# Patient Record
Sex: Male | Born: 1960 | Race: White | Hispanic: No | Marital: Married | Smoking: Never smoker
Health system: Southern US, Community
[De-identification: ages and names within clinical notes are randomized; demographics above are authoritative.]

## PROBLEM LIST (undated history)

## (undated) DIAGNOSIS — Z86010 Personal history of colonic polyps: Secondary | ICD-10-CM

## (undated) DIAGNOSIS — M2550 Pain in unspecified joint: Secondary | ICD-10-CM

## (undated) DIAGNOSIS — I10 Essential (primary) hypertension: Secondary | ICD-10-CM

## (undated) DIAGNOSIS — K219 Gastro-esophageal reflux disease without esophagitis: Secondary | ICD-10-CM

## (undated) DIAGNOSIS — T7840XA Allergy, unspecified, initial encounter: Secondary | ICD-10-CM

## (undated) DIAGNOSIS — R0602 Shortness of breath: Secondary | ICD-10-CM

## (undated) DIAGNOSIS — K589 Irritable bowel syndrome without diarrhea: Secondary | ICD-10-CM

## (undated) DIAGNOSIS — Z21 Asymptomatic human immunodeficiency virus [HIV] infection status: Secondary | ICD-10-CM

## (undated) DIAGNOSIS — R600 Localized edema: Secondary | ICD-10-CM

## (undated) DIAGNOSIS — J189 Pneumonia, unspecified organism: Secondary | ICD-10-CM

## (undated) DIAGNOSIS — B20 Human immunodeficiency virus [HIV] disease: Secondary | ICD-10-CM

## (undated) DIAGNOSIS — M545 Low back pain, unspecified: Secondary | ICD-10-CM

## (undated) DIAGNOSIS — E7800 Pure hypercholesterolemia, unspecified: Secondary | ICD-10-CM

## (undated) DIAGNOSIS — E559 Vitamin D deficiency, unspecified: Secondary | ICD-10-CM

## (undated) DIAGNOSIS — M1990 Unspecified osteoarthritis, unspecified site: Secondary | ICD-10-CM

## (undated) DIAGNOSIS — K2270 Barrett's esophagus without dysplasia: Secondary | ICD-10-CM

## (undated) DIAGNOSIS — K5900 Constipation, unspecified: Secondary | ICD-10-CM

## (undated) HISTORY — PX: ESOPHAGOGASTRODUODENOSCOPY: SHX1529

## (undated) HISTORY — DX: Personal history of colonic polyps: Z86.010

## (undated) HISTORY — PX: TONSILLECTOMY: SUR1361

## (undated) HISTORY — DX: Asymptomatic human immunodeficiency virus (hiv) infection status: Z21

## (undated) HISTORY — DX: Irritable bowel syndrome, unspecified: K58.9

## (undated) HISTORY — DX: Human immunodeficiency virus (HIV) disease: B20

## (undated) HISTORY — PX: COLONOSCOPY: SHX174

## (undated) HISTORY — DX: Low back pain, unspecified: M54.50

## (undated) HISTORY — PX: POLYPECTOMY: SHX149

## (undated) HISTORY — DX: Constipation, unspecified: K59.00

## (undated) HISTORY — DX: Pure hypercholesterolemia, unspecified: E78.00

## (undated) HISTORY — DX: Unspecified osteoarthritis, unspecified site: M19.90

## (undated) HISTORY — DX: Localized edema: R60.0

## (undated) HISTORY — DX: Vitamin D deficiency, unspecified: E55.9

## (undated) HISTORY — PX: COLONOSCOPY W/ BIOPSIES AND POLYPECTOMY: SHX1376

## (undated) HISTORY — PX: UPPER GASTROINTESTINAL ENDOSCOPY: SHX188

## (undated) HISTORY — DX: Shortness of breath: R06.02

## (undated) HISTORY — DX: Pain in unspecified joint: M25.50

## (undated) HISTORY — PX: BACK SURGERY: SHX140

## (undated) HISTORY — DX: Allergy, unspecified, initial encounter: T78.40XA

## (undated) HISTORY — DX: Barrett's esophagus without dysplasia: K22.70

---

## 2001-10-06 ENCOUNTER — Encounter: Payer: Self-pay | Admitting: Emergency Medicine

## 2001-10-07 ENCOUNTER — Inpatient Hospital Stay (HOSPITAL_COMMUNITY): Admission: EM | Admit: 2001-10-07 | Discharge: 2001-10-13 | Payer: Self-pay | Admitting: Emergency Medicine

## 2001-10-09 ENCOUNTER — Encounter (INDEPENDENT_AMBULATORY_CARE_PROVIDER_SITE_OTHER): Payer: Self-pay | Admitting: *Deleted

## 2001-10-09 LAB — CONVERTED CEMR LAB
CD4 Count: 20 microliters
CD4 T Cell Abs: 20

## 2001-10-28 ENCOUNTER — Encounter: Payer: Self-pay | Admitting: Infectious Diseases

## 2001-10-28 ENCOUNTER — Encounter: Admission: RE | Admit: 2001-10-28 | Discharge: 2001-10-28 | Payer: Self-pay | Admitting: Infectious Diseases

## 2001-11-26 ENCOUNTER — Encounter: Admission: RE | Admit: 2001-11-26 | Discharge: 2001-11-26 | Payer: Self-pay | Admitting: Infectious Diseases

## 2002-01-22 ENCOUNTER — Ambulatory Visit (HOSPITAL_COMMUNITY): Admission: RE | Admit: 2002-01-22 | Discharge: 2002-01-22 | Payer: Self-pay | Admitting: Infectious Diseases

## 2002-01-22 ENCOUNTER — Encounter: Admission: RE | Admit: 2002-01-22 | Discharge: 2002-01-22 | Payer: Self-pay | Admitting: Infectious Diseases

## 2002-03-18 ENCOUNTER — Ambulatory Visit (HOSPITAL_COMMUNITY): Admission: RE | Admit: 2002-03-18 | Discharge: 2002-03-18 | Payer: Self-pay | Admitting: Infectious Diseases

## 2002-03-18 ENCOUNTER — Encounter: Admission: RE | Admit: 2002-03-18 | Discharge: 2002-03-18 | Payer: Self-pay | Admitting: Infectious Diseases

## 2002-04-21 ENCOUNTER — Encounter: Admission: RE | Admit: 2002-04-21 | Discharge: 2002-04-21 | Payer: Self-pay | Admitting: Infectious Diseases

## 2002-07-01 ENCOUNTER — Encounter: Admission: RE | Admit: 2002-07-01 | Discharge: 2002-07-01 | Payer: Self-pay | Admitting: Internal Medicine

## 2002-08-03 ENCOUNTER — Encounter: Admission: RE | Admit: 2002-08-03 | Discharge: 2002-08-03 | Payer: Self-pay | Admitting: Infectious Diseases

## 2002-08-03 ENCOUNTER — Ambulatory Visit (HOSPITAL_COMMUNITY): Admission: RE | Admit: 2002-08-03 | Discharge: 2002-08-03 | Payer: Self-pay | Admitting: Infectious Diseases

## 2002-10-16 ENCOUNTER — Encounter: Admission: RE | Admit: 2002-10-16 | Discharge: 2002-10-16 | Payer: Self-pay | Admitting: Internal Medicine

## 2002-10-16 ENCOUNTER — Ambulatory Visit (HOSPITAL_COMMUNITY): Admission: RE | Admit: 2002-10-16 | Discharge: 2002-10-16 | Payer: Self-pay | Admitting: Infectious Diseases

## 2002-11-02 ENCOUNTER — Encounter: Admission: RE | Admit: 2002-11-02 | Discharge: 2002-11-02 | Payer: Self-pay | Admitting: Infectious Diseases

## 2002-12-24 ENCOUNTER — Emergency Department (HOSPITAL_COMMUNITY): Admission: EM | Admit: 2002-12-24 | Discharge: 2002-12-24 | Payer: Self-pay | Admitting: Emergency Medicine

## 2002-12-24 ENCOUNTER — Encounter: Payer: Self-pay | Admitting: Emergency Medicine

## 2002-12-30 ENCOUNTER — Ambulatory Visit (HOSPITAL_COMMUNITY): Admission: RE | Admit: 2002-12-30 | Discharge: 2002-12-30 | Payer: Self-pay | Admitting: Infectious Diseases

## 2002-12-30 ENCOUNTER — Encounter: Admission: RE | Admit: 2002-12-30 | Discharge: 2002-12-30 | Payer: Self-pay | Admitting: Infectious Diseases

## 2003-04-05 ENCOUNTER — Encounter: Payer: Self-pay | Admitting: Infectious Diseases

## 2003-04-05 ENCOUNTER — Encounter: Admission: RE | Admit: 2003-04-05 | Discharge: 2003-04-05 | Payer: Self-pay | Admitting: Infectious Diseases

## 2003-04-21 ENCOUNTER — Encounter: Admission: RE | Admit: 2003-04-21 | Discharge: 2003-04-21 | Payer: Self-pay | Admitting: Infectious Diseases

## 2003-07-07 ENCOUNTER — Encounter: Payer: Self-pay | Admitting: Internal Medicine

## 2003-07-07 ENCOUNTER — Ambulatory Visit (HOSPITAL_COMMUNITY): Admission: RE | Admit: 2003-07-07 | Discharge: 2003-07-07 | Payer: Self-pay | Admitting: Internal Medicine

## 2003-07-07 ENCOUNTER — Encounter: Admission: RE | Admit: 2003-07-07 | Discharge: 2003-07-07 | Payer: Self-pay | Admitting: Infectious Diseases

## 2003-07-28 ENCOUNTER — Encounter: Admission: RE | Admit: 2003-07-28 | Discharge: 2003-07-28 | Payer: Self-pay | Admitting: Infectious Diseases

## 2003-10-05 ENCOUNTER — Encounter: Admission: RE | Admit: 2003-10-05 | Discharge: 2003-10-05 | Payer: Self-pay | Admitting: Internal Medicine

## 2003-10-13 ENCOUNTER — Ambulatory Visit (HOSPITAL_COMMUNITY): Admission: RE | Admit: 2003-10-13 | Discharge: 2003-10-13 | Payer: Self-pay | Admitting: Infectious Diseases

## 2003-10-13 ENCOUNTER — Encounter: Admission: RE | Admit: 2003-10-13 | Discharge: 2003-10-13 | Payer: Self-pay | Admitting: Infectious Diseases

## 2003-10-13 ENCOUNTER — Encounter: Payer: Self-pay | Admitting: Infectious Diseases

## 2003-10-27 ENCOUNTER — Encounter: Admission: RE | Admit: 2003-10-27 | Discharge: 2003-10-27 | Payer: Self-pay | Admitting: Infectious Diseases

## 2003-10-27 ENCOUNTER — Ambulatory Visit (HOSPITAL_COMMUNITY): Admission: RE | Admit: 2003-10-27 | Discharge: 2003-10-27 | Payer: Self-pay | Admitting: Infectious Diseases

## 2003-10-27 ENCOUNTER — Encounter: Payer: Self-pay | Admitting: Infectious Diseases

## 2003-12-03 ENCOUNTER — Encounter: Admission: RE | Admit: 2003-12-03 | Discharge: 2003-12-03 | Payer: Self-pay | Admitting: Infectious Diseases

## 2003-12-03 ENCOUNTER — Ambulatory Visit (HOSPITAL_COMMUNITY): Admission: RE | Admit: 2003-12-03 | Discharge: 2003-12-03 | Payer: Self-pay | Admitting: Infectious Diseases

## 2003-12-03 ENCOUNTER — Encounter: Payer: Self-pay | Admitting: Infectious Diseases

## 2003-12-20 ENCOUNTER — Encounter: Admission: RE | Admit: 2003-12-20 | Discharge: 2003-12-20 | Payer: Self-pay | Admitting: Infectious Diseases

## 2004-03-08 ENCOUNTER — Ambulatory Visit (HOSPITAL_COMMUNITY): Admission: RE | Admit: 2004-03-08 | Discharge: 2004-03-08 | Payer: Self-pay | Admitting: Internal Medicine

## 2004-03-08 ENCOUNTER — Encounter: Admission: RE | Admit: 2004-03-08 | Discharge: 2004-03-08 | Payer: Self-pay | Admitting: Infectious Diseases

## 2004-03-22 ENCOUNTER — Encounter: Admission: RE | Admit: 2004-03-22 | Discharge: 2004-03-22 | Payer: Self-pay | Admitting: Infectious Diseases

## 2004-07-19 ENCOUNTER — Ambulatory Visit (HOSPITAL_COMMUNITY): Admission: RE | Admit: 2004-07-19 | Discharge: 2004-07-19 | Payer: Self-pay | Admitting: Infectious Diseases

## 2004-07-19 ENCOUNTER — Encounter: Admission: RE | Admit: 2004-07-19 | Discharge: 2004-07-19 | Payer: Self-pay | Admitting: Infectious Diseases

## 2004-08-02 ENCOUNTER — Encounter: Admission: RE | Admit: 2004-08-02 | Discharge: 2004-08-02 | Payer: Self-pay | Admitting: Infectious Diseases

## 2004-10-25 ENCOUNTER — Ambulatory Visit: Payer: Self-pay | Admitting: Infectious Diseases

## 2004-10-25 ENCOUNTER — Ambulatory Visit (HOSPITAL_COMMUNITY): Admission: RE | Admit: 2004-10-25 | Discharge: 2004-10-25 | Payer: Self-pay | Admitting: Infectious Diseases

## 2005-01-25 ENCOUNTER — Ambulatory Visit (HOSPITAL_COMMUNITY): Admission: RE | Admit: 2005-01-25 | Discharge: 2005-01-25 | Payer: Self-pay | Admitting: Infectious Diseases

## 2005-01-25 ENCOUNTER — Ambulatory Visit: Payer: Self-pay | Admitting: Infectious Diseases

## 2005-02-12 ENCOUNTER — Ambulatory Visit: Payer: Self-pay | Admitting: Infectious Diseases

## 2005-05-09 ENCOUNTER — Ambulatory Visit: Payer: Self-pay | Admitting: Infectious Diseases

## 2005-05-09 ENCOUNTER — Ambulatory Visit (HOSPITAL_COMMUNITY): Admission: RE | Admit: 2005-05-09 | Discharge: 2005-05-09 | Payer: Self-pay | Admitting: Infectious Diseases

## 2005-07-06 ENCOUNTER — Ambulatory Visit: Payer: Self-pay | Admitting: Infectious Diseases

## 2005-10-24 ENCOUNTER — Ambulatory Visit (HOSPITAL_COMMUNITY): Admission: RE | Admit: 2005-10-24 | Discharge: 2005-10-24 | Payer: Self-pay | Admitting: Infectious Diseases

## 2005-10-24 ENCOUNTER — Ambulatory Visit: Payer: Self-pay | Admitting: Infectious Diseases

## 2005-11-07 ENCOUNTER — Ambulatory Visit (HOSPITAL_COMMUNITY): Admission: RE | Admit: 2005-11-07 | Discharge: 2005-11-07 | Payer: Self-pay | Admitting: Infectious Diseases

## 2005-11-07 ENCOUNTER — Ambulatory Visit: Payer: Self-pay | Admitting: Infectious Diseases

## 2005-11-14 ENCOUNTER — Ambulatory Visit: Payer: Self-pay | Admitting: Infectious Diseases

## 2006-02-05 ENCOUNTER — Ambulatory Visit: Payer: Self-pay | Admitting: Infectious Diseases

## 2006-02-20 ENCOUNTER — Ambulatory Visit: Payer: Self-pay | Admitting: Infectious Diseases

## 2006-04-24 ENCOUNTER — Encounter: Admission: RE | Admit: 2006-04-24 | Discharge: 2006-04-24 | Payer: Self-pay | Admitting: Infectious Diseases

## 2006-04-24 ENCOUNTER — Ambulatory Visit: Payer: Self-pay | Admitting: Infectious Diseases

## 2006-09-02 ENCOUNTER — Encounter (INDEPENDENT_AMBULATORY_CARE_PROVIDER_SITE_OTHER): Payer: Self-pay | Admitting: *Deleted

## 2006-09-02 ENCOUNTER — Encounter: Admission: RE | Admit: 2006-09-02 | Discharge: 2006-09-02 | Payer: Self-pay | Admitting: Infectious Diseases

## 2006-09-02 ENCOUNTER — Ambulatory Visit: Payer: Self-pay | Admitting: Infectious Diseases

## 2006-09-18 ENCOUNTER — Ambulatory Visit: Payer: Self-pay | Admitting: Infectious Diseases

## 2006-09-18 ENCOUNTER — Encounter (INDEPENDENT_AMBULATORY_CARE_PROVIDER_SITE_OTHER): Payer: Self-pay | Admitting: *Deleted

## 2006-09-18 LAB — CONVERTED CEMR LAB
CD4 Count: 470 microliters
HIV 1 RNA Quant: 77399 copies/mL

## 2006-09-19 ENCOUNTER — Ambulatory Visit (HOSPITAL_COMMUNITY): Admission: RE | Admit: 2006-09-19 | Discharge: 2006-09-19 | Payer: Self-pay | Admitting: Infectious Diseases

## 2006-09-30 DIAGNOSIS — B59 Pneumocystosis: Secondary | ICD-10-CM

## 2006-09-30 DIAGNOSIS — E881 Lipodystrophy, not elsewhere classified: Secondary | ICD-10-CM

## 2006-09-30 DIAGNOSIS — B20 Human immunodeficiency virus [HIV] disease: Secondary | ICD-10-CM | POA: Insufficient documentation

## 2006-09-30 DIAGNOSIS — A6000 Herpesviral infection of urogenital system, unspecified: Secondary | ICD-10-CM

## 2007-01-01 ENCOUNTER — Encounter (INDEPENDENT_AMBULATORY_CARE_PROVIDER_SITE_OTHER): Payer: Self-pay | Admitting: *Deleted

## 2007-01-01 ENCOUNTER — Ambulatory Visit: Payer: Self-pay | Admitting: Infectious Diseases

## 2007-01-01 ENCOUNTER — Encounter: Admission: RE | Admit: 2007-01-01 | Discharge: 2007-01-01 | Payer: Self-pay | Admitting: Infectious Diseases

## 2007-01-01 LAB — CONVERTED CEMR LAB
ALT: 16 units/L (ref 0–53)
AST: 15 units/L (ref 0–37)
Albumin: 4.6 g/dL (ref 3.5–5.2)
Alkaline Phosphatase: 109 units/L (ref 39–117)
BUN: 10 mg/dL (ref 6–23)
Basophils Absolute: 0 10*3/uL (ref 0.0–0.1)
Basophils Relative: 0 % (ref 0–1)
CD4 Count: 460 microliters
CO2: 23 meq/L (ref 19–32)
Calcium: 9 mg/dL (ref 8.4–10.5)
Chloride: 105 meq/L (ref 96–112)
Creatinine, Ser: 0.88 mg/dL (ref 0.40–1.50)
Eosinophils Relative: 3 % (ref 0–5)
Glucose, Bld: 101 mg/dL — ABNORMAL HIGH (ref 70–99)
HCT: 41.4 % (ref 39.0–52.0)
HIV 1 RNA Quant: 275 copies/mL
HIV 1 RNA Quant: 275 copies/mL — ABNORMAL HIGH (ref <50)
HIV-1 RNA Quant, Log: 2.44 — ABNORMAL HIGH (ref <1.70)
Hemoglobin: 15 g/dL (ref 13.0–17.0)
Lymphocytes Relative: 33 % (ref 12–46)
Lymphs Abs: 1.7 10*3/uL (ref 0.7–3.3)
MCHC: 36.2 g/dL — ABNORMAL HIGH (ref 30.0–36.0)
MCV: 90.6 fL (ref 78.0–100.0)
Monocytes Absolute: 0.4 10*3/uL (ref 0.2–0.7)
Monocytes Relative: 7 % (ref 3–11)
Neutro Abs: 3 10*3/uL (ref 1.7–7.7)
Neutrophils Relative %: 57 % (ref 43–77)
Platelets: 208 10*3/uL (ref 150–400)
Potassium: 4 meq/L (ref 3.5–5.3)
RBC: 4.57 M/uL (ref 4.22–5.81)
RDW: 13 % (ref 11.5–14.0)
Sodium: 138 meq/L (ref 135–145)
Total Bilirubin: 0.4 mg/dL (ref 0.3–1.2)
Total Protein: 6.8 g/dL (ref 6.0–8.3)
WBC: 5.3 10*3/uL (ref 4.0–10.5)

## 2007-01-02 DIAGNOSIS — E781 Pure hyperglyceridemia: Secondary | ICD-10-CM | POA: Insufficient documentation

## 2007-01-02 DIAGNOSIS — J309 Allergic rhinitis, unspecified: Secondary | ICD-10-CM | POA: Insufficient documentation

## 2007-01-27 ENCOUNTER — Encounter: Payer: Self-pay | Admitting: Infectious Diseases

## 2007-01-27 ENCOUNTER — Ambulatory Visit: Payer: Self-pay | Admitting: Infectious Diseases

## 2007-01-27 LAB — CONVERTED CEMR LAB: Testosterone: 360.47 ng/dL (ref 350–890)

## 2007-02-10 ENCOUNTER — Encounter (INDEPENDENT_AMBULATORY_CARE_PROVIDER_SITE_OTHER): Payer: Self-pay | Admitting: *Deleted

## 2007-02-10 LAB — CONVERTED CEMR LAB

## 2007-02-19 ENCOUNTER — Telehealth: Payer: Self-pay | Admitting: Infectious Diseases

## 2007-02-23 ENCOUNTER — Encounter (INDEPENDENT_AMBULATORY_CARE_PROVIDER_SITE_OTHER): Payer: Self-pay | Admitting: *Deleted

## 2007-02-24 ENCOUNTER — Encounter (INDEPENDENT_AMBULATORY_CARE_PROVIDER_SITE_OTHER): Payer: Self-pay | Admitting: *Deleted

## 2007-02-24 ENCOUNTER — Telehealth (INDEPENDENT_AMBULATORY_CARE_PROVIDER_SITE_OTHER): Payer: Self-pay | Admitting: Infectious Diseases

## 2007-06-24 ENCOUNTER — Ambulatory Visit: Payer: Self-pay | Admitting: Infectious Diseases

## 2007-06-24 ENCOUNTER — Encounter: Admission: RE | Admit: 2007-06-24 | Discharge: 2007-06-24 | Payer: Self-pay | Admitting: Infectious Diseases

## 2007-06-24 LAB — CONVERTED CEMR LAB
AST: 18 units/L (ref 0–37)
Albumin: 4.6 g/dL (ref 3.5–5.2)
Alkaline Phosphatase: 109 units/L (ref 39–117)
BUN: 14 mg/dL (ref 6–23)
Calcium: 9.4 mg/dL (ref 8.4–10.5)
Chloride: 106 meq/L (ref 96–112)
Eosinophils Absolute: 0.1 10*3/uL (ref 0.0–0.7)
Glucose, Bld: 95 mg/dL (ref 70–99)
HIV 1 RNA Quant: <50 copies/mL (ref <50)
HIV-1 RNA Quant, Log: <1.7 (ref <1.70)
LDL Cholesterol: 137 mg/dL — ABNORMAL HIGH (ref 0–99)
Leukocytes, UA: NEGATIVE
Lymphs Abs: 1.5 10*3/uL (ref 0.7–3.3)
MCV: 93.4 fL (ref 78.0–100.0)
Monocytes Relative: 6 % (ref 3–11)
Neutro Abs: 3.3 10*3/uL (ref 1.7–7.7)
Neutrophils Relative %: 63 % (ref 43–77)
Nitrite: NEGATIVE
Platelets: 202 10*3/uL (ref 150–400)
Potassium: 4.1 meq/L (ref 3.5–5.3)
RBC: 4.83 M/uL (ref 4.22–5.81)
Sodium: 139 meq/L (ref 135–145)
Specific Gravity, Urine: 1.029 (ref 1.005–1.03)
Total Protein: 7.4 g/dL (ref 6.0–8.3)
Triglycerides: 203 mg/dL — ABNORMAL HIGH (ref <150)
Urobilinogen, UA: 0.2 (ref 0.0–1.0)
WBC: 5.2 10*3/uL (ref 4.0–10.5)
pH: 6 (ref 5.0–8.0)

## 2007-06-25 ENCOUNTER — Encounter: Payer: Self-pay | Admitting: Infectious Diseases

## 2007-06-25 LAB — CONVERTED CEMR LAB: CD4 Count: 410 microliters

## 2007-07-16 ENCOUNTER — Ambulatory Visit: Payer: Self-pay | Admitting: Infectious Diseases

## 2007-07-16 ENCOUNTER — Encounter (INDEPENDENT_AMBULATORY_CARE_PROVIDER_SITE_OTHER): Payer: Self-pay | Admitting: *Deleted

## 2007-07-18 ENCOUNTER — Encounter: Payer: Self-pay | Admitting: Infectious Diseases

## 2007-07-25 ENCOUNTER — Telehealth: Payer: Self-pay

## 2007-07-25 ENCOUNTER — Telehealth: Payer: Self-pay | Admitting: Infectious Diseases

## 2007-07-28 ENCOUNTER — Telehealth: Payer: Self-pay | Admitting: Infectious Diseases

## 2007-07-29 ENCOUNTER — Encounter: Payer: Self-pay | Admitting: Infectious Diseases

## 2007-08-15 ENCOUNTER — Telehealth: Payer: Self-pay | Admitting: Infectious Diseases

## 2007-11-03 ENCOUNTER — Encounter: Admission: RE | Admit: 2007-11-03 | Discharge: 2007-11-03 | Payer: Self-pay | Admitting: Infectious Diseases

## 2007-11-03 ENCOUNTER — Ambulatory Visit: Payer: Self-pay | Admitting: Infectious Diseases

## 2007-11-03 LAB — CONVERTED CEMR LAB
ALT: 16 units/L (ref 0–53)
AST: 16 units/L (ref 0–37)
Basophils Absolute: 0 10*3/uL (ref 0.0–0.1)
Basophils Relative: 0 % (ref 0–1)
Calcium: 9.1 mg/dL (ref 8.4–10.5)
Chloride: 107 meq/L (ref 96–112)
Creatinine, Ser: 0.91 mg/dL (ref 0.40–1.50)
MCHC: 33.5 g/dL (ref 30.0–36.0)
Monocytes Absolute: 0.3 10*3/uL (ref 0.1–1.0)
Neutro Abs: 2.8 10*3/uL (ref 1.7–7.7)
Neutrophils Relative %: 61 % (ref 43–77)
Platelets: 163 10*3/uL (ref 150–400)
Potassium: 4.5 meq/L (ref 3.5–5.3)
RDW: 13.4 % (ref 11.5–15.5)
Sodium: 140 meq/L (ref 135–145)
Total Protein: 6.8 g/dL (ref 6.0–8.3)

## 2007-11-17 ENCOUNTER — Ambulatory Visit: Payer: Self-pay | Admitting: Infectious Diseases

## 2007-12-16 ENCOUNTER — Encounter (INDEPENDENT_AMBULATORY_CARE_PROVIDER_SITE_OTHER): Payer: Self-pay | Admitting: *Deleted

## 2007-12-18 HISTORY — PX: WISDOM TOOTH EXTRACTION: SHX21

## 2008-03-01 ENCOUNTER — Encounter: Admission: RE | Admit: 2008-03-01 | Discharge: 2008-03-01 | Payer: Self-pay | Admitting: Infectious Diseases

## 2008-03-01 ENCOUNTER — Ambulatory Visit: Payer: Self-pay | Admitting: Infectious Diseases

## 2008-03-01 ENCOUNTER — Encounter (INDEPENDENT_AMBULATORY_CARE_PROVIDER_SITE_OTHER): Payer: Self-pay | Admitting: *Deleted

## 2008-03-01 LAB — CONVERTED CEMR LAB
AST: 17 units/L (ref 0–37)
Alkaline Phosphatase: 108 units/L (ref 39–117)
BUN: 11 mg/dL (ref 6–23)
Basophils Relative: 0 % (ref 0–1)
Creatinine, Ser: 0.84 mg/dL (ref 0.40–1.50)
Eosinophils Absolute: 0.1 10*3/uL (ref 0.0–0.7)
MCHC: 35.1 g/dL (ref 30.0–36.0)
MCV: 92.4 fL (ref 78.0–100.0)
Monocytes Absolute: 0.3 10*3/uL (ref 0.1–1.0)
Monocytes Relative: 7 % (ref 3–12)
Neutrophils Relative %: 60 % (ref 43–77)
Potassium: 3.9 meq/L (ref 3.5–5.3)
RBC: 4.59 M/uL (ref 4.22–5.81)

## 2008-03-18 ENCOUNTER — Encounter (INDEPENDENT_AMBULATORY_CARE_PROVIDER_SITE_OTHER): Payer: Self-pay | Admitting: *Deleted

## 2008-03-22 ENCOUNTER — Ambulatory Visit: Payer: Self-pay | Admitting: Infectious Diseases

## 2008-03-22 LAB — CONVERTED CEMR LAB
Cholesterol: 168 mg/dL (ref 0–200)
Total CHOL/HDL Ratio: 4.1
Triglycerides: 82 mg/dL (ref <150)
VLDL: 16 mg/dL (ref 0–40)

## 2008-08-10 ENCOUNTER — Ambulatory Visit: Payer: Self-pay | Admitting: Infectious Diseases

## 2008-08-10 LAB — CONVERTED CEMR LAB
Alkaline Phosphatase: 84 units/L (ref 39–117)
Basophils Absolute: 0 10*3/uL (ref 0.0–0.1)
Eosinophils Absolute: 0.1 10*3/uL (ref 0.0–0.7)
Eosinophils Relative: 1 % (ref 0–5)
Glucose, Bld: 95 mg/dL (ref 70–99)
HCT: 42.5 % (ref 39.0–52.0)
LDL Cholesterol: 129 mg/dL — ABNORMAL HIGH (ref 0–99)
Lymphs Abs: 1.4 10*3/uL (ref 0.7–4.0)
MCV: 93 fL (ref 78.0–100.0)
Platelets: 155 10*3/uL (ref 150–400)
RDW: 12.7 % (ref 11.5–15.5)
Sodium: 141 meq/L (ref 135–145)
Total Bilirubin: 0.4 mg/dL (ref 0.3–1.2)
Total CHOL/HDL Ratio: 3.8
Total Protein: 6.8 g/dL (ref 6.0–8.3)
Triglycerides: 84 mg/dL (ref <150)
VLDL: 17 mg/dL (ref 0–40)

## 2008-08-25 ENCOUNTER — Ambulatory Visit: Payer: Self-pay | Admitting: Infectious Diseases

## 2008-08-25 DIAGNOSIS — E291 Testicular hypofunction: Secondary | ICD-10-CM | POA: Insufficient documentation

## 2008-10-13 ENCOUNTER — Ambulatory Visit: Payer: Self-pay | Admitting: Infectious Diseases

## 2008-10-18 ENCOUNTER — Telehealth (INDEPENDENT_AMBULATORY_CARE_PROVIDER_SITE_OTHER): Payer: Self-pay | Admitting: *Deleted

## 2009-01-17 ENCOUNTER — Ambulatory Visit: Payer: Self-pay | Admitting: Infectious Diseases

## 2009-01-17 LAB — CONVERTED CEMR LAB
ALT: 28 units/L (ref 0–53)
AST: 21 units/L (ref 0–37)
Albumin: 4.4 g/dL (ref 3.5–5.2)
Alkaline Phosphatase: 93 units/L (ref 39–117)
Basophils Absolute: 0 10*3/uL (ref 0.0–0.1)
Basophils Relative: 0 % (ref 0–1)
Eosinophils Absolute: 0.1 10*3/uL (ref 0.0–0.7)
Lymphs Abs: 1.8 10*3/uL (ref 0.7–4.0)
MCHC: 34 g/dL (ref 30.0–36.0)
MCV: 89.9 fL (ref 78.0–100.0)
Neutrophils Relative %: 60 % (ref 43–77)
Platelets: 172 10*3/uL (ref 150–400)
Potassium: 3.9 meq/L (ref 3.5–5.3)
RDW: 12.4 % (ref 11.5–15.5)
Sodium: 139 meq/L (ref 135–145)
Total Bilirubin: 0.3 mg/dL (ref 0.3–1.2)
Total Protein: 6.7 g/dL (ref 6.0–8.3)
WBC: 5.8 10*3/uL (ref 4.0–10.5)

## 2009-01-31 ENCOUNTER — Ambulatory Visit: Payer: Self-pay | Admitting: Infectious Diseases

## 2009-07-13 ENCOUNTER — Ambulatory Visit: Payer: Self-pay | Admitting: Infectious Diseases

## 2009-07-13 LAB — CONVERTED CEMR LAB
Albumin: 4.5 g/dL (ref 3.5–5.2)
BUN: 15 mg/dL (ref 6–23)
Basophils Absolute: 0 10*3/uL (ref 0.0–0.1)
CO2: 18 meq/L — ABNORMAL LOW (ref 19–32)
Calcium: 9.1 mg/dL (ref 8.4–10.5)
Chloride: 108 meq/L (ref 96–112)
Creatinine, Ser: 0.93 mg/dL (ref 0.40–1.50)
Eosinophils Relative: 1 % (ref 0–5)
HCT: 43.7 % (ref 39.0–52.0)
HIV 1 RNA Quant: 83 copies/mL — ABNORMAL HIGH (ref <48)
HIV 1 RNA Quant: 83 copies/mL — ABNORMAL HIGH (ref <48)
HIV-1 RNA Quant, Log: 1.92 — ABNORMAL HIGH (ref <1.68)
Hemoglobin: 15.2 g/dL (ref 13.0–17.0)
Lymphocytes Relative: 29 % (ref 12–46)
Lymphs Abs: 1.4 10*3/uL (ref 0.7–4.0)
Neutro Abs: 3.2 10*3/uL (ref 1.7–7.7)
Platelets: 171 10*3/uL (ref 150–400)
Potassium: 4 meq/L (ref 3.5–5.3)
RDW: 12.9 % (ref 11.5–15.5)
WBC: 4.9 10*3/uL (ref 4.0–10.5)

## 2009-08-03 ENCOUNTER — Ambulatory Visit: Payer: Self-pay | Admitting: Infectious Diseases

## 2009-08-03 DIAGNOSIS — L03317 Cellulitis of buttock: Secondary | ICD-10-CM

## 2009-08-03 DIAGNOSIS — L0231 Cutaneous abscess of buttock: Secondary | ICD-10-CM

## 2010-01-05 ENCOUNTER — Ambulatory Visit: Payer: Self-pay | Admitting: Infectious Diseases

## 2010-01-05 LAB — CONVERTED CEMR LAB
ALT: 38 units/L (ref 0–53)
AST: 25 units/L (ref 0–37)
Alkaline Phosphatase: 115 units/L (ref 39–117)
Basophils Absolute: 0 10*3/uL (ref 0.0–0.1)
Basophils Relative: 1 % (ref 0–1)
CO2: 23 meq/L (ref 19–32)
Cholesterol: 216 mg/dL — ABNORMAL HIGH (ref 0–200)
Creatinine, Ser: 0.86 mg/dL (ref 0.40–1.50)
Eosinophils Absolute: 0.1 10*3/uL (ref 0.0–0.7)
HIV 1 RNA Quant: 406 copies/mL — ABNORMAL HIGH (ref <48)
LDL Cholesterol: 130 mg/dL — ABNORMAL HIGH (ref 0–99)
MCHC: 35.3 g/dL (ref 30.0–36.0)
MCV: 89.3 fL (ref >=78.0)
Neutro Abs: 4.5 10*3/uL (ref 1.7–7.7)
Neutrophils Relative %: 64 % (ref 43–77)
Platelets: 199 10*3/uL (ref 150–400)
RBC: 4.67 M/uL (ref 4.22–5.81)
RDW: 12.2 % (ref 11.5–15.5)
Sodium: 137 meq/L (ref 135–145)
Total Bilirubin: 0.3 mg/dL (ref 0.3–1.2)
Total CHOL/HDL Ratio: 6
Total Protein: 7.4 g/dL (ref 6.0–8.3)
VLDL: 50 mg/dL — ABNORMAL HIGH (ref 0–40)

## 2010-01-19 ENCOUNTER — Ambulatory Visit: Payer: Self-pay | Admitting: Infectious Diseases

## 2010-10-24 ENCOUNTER — Ambulatory Visit: Payer: Self-pay | Admitting: Infectious Diseases

## 2010-10-24 LAB — CONVERTED CEMR LAB
AST: 26 units/L (ref 0–37)
Albumin: 4.5 g/dL (ref 3.5–5.2)
BUN: 13 mg/dL (ref 6–23)
Basophils Absolute: 0 10*3/uL (ref 0.0–0.1)
Calcium: 8.9 mg/dL (ref 8.4–10.5)
Chloride: 108 meq/L (ref 96–112)
Creatinine, Ser: 0.92 mg/dL (ref 0.40–1.50)
Eosinophils Relative: 2 % (ref 0–5)
Glucose, Bld: 82 mg/dL (ref 70–99)
HCT: 41.4 % (ref 39.0–52.0)
HDL: 41 mg/dL (ref >39)
HIV 1 RNA Quant: 31 copies/mL — ABNORMAL HIGH (ref <20)
HIV-1 RNA Quant, Log: 1.49 — ABNORMAL HIGH (ref <1.30)
Hemoglobin: 14.3 g/dL (ref 13.0–17.0)
LDL Cholesterol: 141 mg/dL — ABNORMAL HIGH (ref 0–99)
Lymphocytes Relative: 33 % (ref 12–46)
Lymphs Abs: 2 10*3/uL (ref 0.7–4.0)
Monocytes Absolute: 0.4 10*3/uL (ref 0.1–1.0)
Monocytes Relative: 7 % (ref 3–12)
Neutro Abs: 3.4 10*3/uL (ref 1.7–7.7)
Potassium: 3.9 meq/L (ref 3.5–5.3)
RBC: 4.57 M/uL (ref 4.22–5.81)
Total CHOL/HDL Ratio: 5.2
VLDL: 32 mg/dL (ref 0–40)

## 2010-11-06 ENCOUNTER — Ambulatory Visit: Payer: Self-pay | Admitting: Infectious Diseases

## 2010-11-06 ENCOUNTER — Encounter (INDEPENDENT_AMBULATORY_CARE_PROVIDER_SITE_OTHER): Payer: Self-pay | Admitting: *Deleted

## 2011-01-16 NOTE — Miscellaneous (Signed)
  Clinical Lists Changes  Observations: Added new observation of YEARAIDSPOS: 2002  (11/06/2010 13:05) Added new observation of HIV STATUS: CDC-defined AIDS  (11/06/2010 13:05)

## 2011-01-16 NOTE — Assessment & Plan Note (Signed)
Summary: f/u appnt/kdw   CC:  follow-up visit.  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 CD4 620, VL 406 (01-05-10). getting over a recent cold. has some remnants of a scratchy throat. has been eating alot, gaining weight.  no problems with meds.   Preventive Screening-Counseling & Management  Alcohol-Tobacco     Alcohol drinks/day: occassionally     Alcohol type: beer     Smoking Status: never  Caffeine-Diet-Exercise     Caffeine use/day: coffee and tea     Does Patient Exercise: yes     Type of exercise: walking at work     Exercise (avg: min/session): >60     Times/week: 5  Safety-Violence-Falls     Seat Belt Use: yes  Current Medications (verified): 1)  Atripla 600-200-300 Mg Tabs (Efavirenz-Emtricitab-Tenofovir) .... Take 1 Tablet By Mouth At Bedtime 2)  Fexofenadine Hcl 180 Mg  Tabs (Fexofenadine Hcl) .... Take 1 Tablet By Mouth Once A Day 3)  Androgel 50 Mg/5gm Gel (Testosterone) .... Apply 5g Topically Once Daily  Allergies (verified): 1)  ! Amoxicillin    Updated Prior Medication List: ATRIPLA 600-200-300 MG TABS (EFAVIRENZ-EMTRICITAB-TENOFOVIR) Take 1 tablet by mouth at bedtime FEXOFENADINE HCL 180 MG  TABS (FEXOFENADINE HCL) Take 1 tablet by mouth once a day ANDROGEL 50 MG/5GM GEL (TESTOSTERONE) apply 5g topically once daily  Current Allergies (reviewed today): ! AMOXICILLIN Review of Systems       wt up 12#, has recognized eating problems, exercising less,   Vital Signs:  Patient profile:   50 year old male Height:      69 inches (175.26 cm) Weight:      198.2 pounds (90.09 kg) BMI:     29.37 Temp:     97.6 degrees F (36.44 degrees C) oral Pulse rate:   80 / minute BP sitting:   128 / 81  (left arm)  Vitals Entered By: Baxter Hire) (January 19, 2010 4:25 PM) CC: follow-up visit Is Patient Diabetic? No Pain Assessment Patient in pain? no      Nutritional Status BMI of 25 - 29 = overweight Nutritional  Status Detail appetite is good per patient  Have you ever been in a relationship where you felt threatened, hurt or afraid?No   Does patient need assistance? Functional Status Self care Ambulation Normal        Medication Adherence: 01/19/2010   Adherence to medications reviewed with patient. Counseling to provide adequate adherence provided   Prevention For Positives: 01/19/2010   Safe sex practices discussed with patient. Condoms offered.                             Physical Exam  General:  well-developed.   Eyes:  pupils equal, pupils round, and pupils reactive to light.   Mouth:  pharynx pink and moist, no exudates, and poor dentition.   Neck:  no masses.   Lungs:  normal respiratory effort and normal breath sounds.   Heart:  normal rate, regular rhythm, and no murmur.   Abdomen:  soft, non-tender, and normal bowel sounds.     Impression & Recommendations:  Problem # 1:  HIV DISEASE (ICD-042) he is doing well. offered condoms, taking meds well. small increase in VL as done while he had a cold.  return to clinic 6 months.  The following medications were removed from the medication list:    Doxycycline Hyclate 100 Mg  Caps (Doxycycline hyclate) .Marland Kitchen... Take 1 tablet by mouth two times a day  Problem # 2:  HYPERLIPIDEMIA (ICD-272.4) Trig 250 and Chol in low 200s. he will imrpove diet and exercise.   Other Orders: Est. Patient Level IV (16109) Future Orders: T-CD4SP ([facility]) (CD4SP) ... 07/18/2010 T-HIV Viral Load [id number]) ... 07/18/2010 T-Comprehensive Metabolic Panel [phone]) ... 07/18/2010 T-CBC w/Diff ([id number]) ... 07/18/2010 T-Lipid Profile [phone]) ... 07/18/2010  Process Orders Check Orders Results:     [facility]: ABN not required for this insurance Tests Sent for requisitioning (January 19, 2010 4:45 PM):     07/18/2010: [facility] -- T-HIV Viral Load [phone] (signed)     07/18/2010: [facility] -- T-Comprehensive Metabolic Panel [[id number]] (signed)     07/18/2010: [facility] -- T-CBC w/Diff [[id number]] (signed)     07/18/2010: [facility] -- T-Lipid Profile [phone] (signed)    Influenza Immunization History:    Influenza # 1:  Historical (11/15/2009)

## 2011-01-16 NOTE — Assessment & Plan Note (Signed)
Summary: Follow-up appt and LABS   CC:  follow-up visit.  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 CD4 700, VL 31 (10-22-10). doing well, no probs wih meds.   Preventive Screening-Counseling & Management  Alcohol-Tobacco     Alcohol drinks/day: occassionally     Alcohol type: beer     Smoking Status: never  Caffeine-Diet-Exercise     Caffeine use/day: coffee and tea     Does Patient Exercise: yes     Type of exercise: walking at work     Exercise (avg: min/session): >60     Times/week: 5  Hep-HIV-STD-Contraception     HIV Risk: no risk noted     HIV Risk Counseling: not indicated-no HIV risk noted  Safety-Violence-Falls     Seat Belt Use: yes  Comments: declined condoms      Sexual History:  currently monogamous.        Drug Use:  never.     Updated Prior Medication List: ATRIPLA 600-200-300 MG TABS (EFAVIRENZ-EMTRICITAB-TENOFOVIR) Take 1 tablet by mouth at bedtime FEXOFENADINE HCL 180 MG  TABS (FEXOFENADINE HCL) Take 1 tablet by mouth once a day ANDROGEL 50 MG/5GM GEL (TESTOSTERONE) apply 5g topically once daily  Current Allergies (reviewed today): ! AMOXICILLIN Social History: Sexual History:  currently monogamous Drug Use:  never  Review of Systems       23# Wt loss- exercising, watching diet.    Vital Signs:  Patient profile:   50 year old male Height:      69 inches (175.26 cm) Weight:      175.75 pounds (79.89 kg) BMI:     26.05 Temp:     98.7 degrees F (37.06 degrees C) oral Pulse rate:   86 / minute BP sitting:   133 / 85  (left arm) Cuff size:   regular  Vitals Entered By: Jennet Maduro RN (November 06, 2010 3:29 PM) CC: follow-up visit Is Patient Diabetic? No Pain Assessment Patient in pain? no      Nutritional Status BMI of 25 - 29 = overweight Nutritional Status Detail appetite "good"  Have you ever been in a relationship where you felt threatened, hurt or afraid?No   Does patient need  assistance? Functional Status Self care Ambulation Normal Comments no missed doses of rxes   Physical Exam  General:  well-developed, well-nourished, and well-hydrated.   Eyes:  pupils equal, pupils round, and pupils reactive to light.   Mouth:  pharynx pink and moist, no exudates, and poor dentition.   Neck:  no masses.   Lungs:  normal respiratory effort and normal breath sounds.   Heart:  normal rate, regular rhythm, and no murmur.   Abdomen:  soft, non-tender, and normal bowel sounds.          Medication Adherence: 11/06/2010   Adherence to medications reviewed with patient. Counseling to provide adequate adherence provided   Prevention For Positives: 11/06/2010   Safe sex practices discussed with patient. Condoms offered.                             Impression & Recommendations:  Problem # 1:  HIV DISEASE (ICD-042) he is doing well. CD4 is up, VL down. has gotten flu shot. offered condoms. will return to clinic 6 months with labs prior.   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 wt loss is encouraging.   Other Orders:  Influenza Vaccine NON MCR (04540) Est. Patient Level IV (98119) Future Orders: T-CD4SP ([facility]) (CD4SP) ... 05/05/2011 T-HIV Viral Load [id number]) ... 05/05/2011 T-Comprehensive Metabolic Panel [phone]) ... 05/05/2011 T-CBC w/Diff ([id number]) ... 05/05/2011 T-RPR (Syphilis) [phone] [id number]) ... 05/05/2011 T-Lipid Profile [id number]) ... 05/05/2011    Influenza Vaccine    Vaccine Type: Fluvax Non-MCR    Site: left deltoid    Mfr: novartis    Dose: 0.5 ml    Route: IM    Given by: Jennet Maduro RN    Exp. Date: 03/18/2011    Lot #: [id number]  Flu Vaccine Consent Questions    Do you have a history of severe allergic reactions to this vaccine? no    Any prior history of allergic reactions to egg and/or gelatin? no    Do you have a sensitivity to the preservative Thimersol? no    Do you have a  past history of Guillan-Barre Syndrome? no    Do you currently have an acute febrile illness? no    Have you ever had a severe reaction to latex? no    Vaccine information given and explained to patient? yes

## 2011-02-19 ENCOUNTER — Emergency Department (HOSPITAL_COMMUNITY)
Admission: EM | Admit: 2011-02-19 | Discharge: 2011-02-19 | Disposition: A | Discharge status: Home / Self Care | Payer: Worker's Compensation | Attending: Emergency Medicine | Admitting: Emergency Medicine

## 2011-02-19 ENCOUNTER — Emergency Department (HOSPITAL_COMMUNITY): Payer: Worker's Compensation

## 2011-02-19 DIAGNOSIS — S0990XA Unspecified injury of head, initial encounter: Secondary | ICD-10-CM | POA: Insufficient documentation

## 2011-02-19 DIAGNOSIS — Y990 Civilian activity done for income or pay: Secondary | ICD-10-CM | POA: Insufficient documentation

## 2011-02-19 DIAGNOSIS — W19XXXA Unspecified fall, initial encounter: Secondary | ICD-10-CM | POA: Insufficient documentation

## 2011-02-19 DIAGNOSIS — W1809XA Striking against other object with subsequent fall, initial encounter: Secondary | ICD-10-CM | POA: Insufficient documentation

## 2011-02-19 DIAGNOSIS — S139XXA Sprain of joints and ligaments of unspecified parts of neck, initial encounter: Secondary | ICD-10-CM | POA: Insufficient documentation

## 2011-02-19 DIAGNOSIS — Y9269 Other specified industrial and construction area as the place of occurrence of the external cause: Secondary | ICD-10-CM | POA: Insufficient documentation

## 2011-02-27 LAB — T-HELPER CELL (CD4) - (RCID CLINIC ONLY): CD4 T Cell Abs: 700 uL (ref 400–2700)

## 2011-03-04 LAB — T-HELPER CELL (CD4) - (RCID CLINIC ONLY)
CD4 % Helper T Cell: 35 % (ref 33–55)
CD4 T Cell Abs: 620 uL (ref 400–2700)

## 2011-03-08 ENCOUNTER — Other Ambulatory Visit: Payer: Self-pay | Admitting: *Deleted

## 2011-03-08 ENCOUNTER — Encounter: Payer: Self-pay | Admitting: *Deleted

## 2011-03-08 DIAGNOSIS — E291 Testicular hypofunction: Secondary | ICD-10-CM

## 2011-03-08 MED ORDER — TESTOSTERONE 50 MG/5GM (1%) TD GEL: 5.0000 g | Freq: Every day | TRANSDERMAL | Status: DC

## 2011-03-15 NOTE — Miscellaneous (Signed)
Summary: rw update  Clinical Lists Changes  Observations: Added new observation of AIDSDAP: No (03/08/2011 12:02) Added new observation of PAYOR: Private (03/08/2011 12:02) Added new observation of RW VITAL STA: Active (03/08/2011 12:02)

## 2011-03-23 ENCOUNTER — Other Ambulatory Visit: Payer: Self-pay | Admitting: Infectious Diseases

## 2011-03-26 ENCOUNTER — Other Ambulatory Visit: Payer: Self-pay | Admitting: *Deleted

## 2011-03-26 DIAGNOSIS — B20 Human immunodeficiency virus [HIV] disease: Secondary | ICD-10-CM

## 2011-03-26 MED ORDER — EFAVIRENZ-EMTRICITAB-TENOFOVIR 600-200-300 MG PO TABS: 1.0000 | ORAL_TABLET | Freq: Every day | ORAL | Status: DC

## 2011-03-26 NOTE — Telephone Encounter (Deleted)
Called [facility]. Faxed recent notes & letter asking that he be seen by one of the mds there for his chronic hip pain. He now has medicaid. They will call us back with appt if they agree to take him. Rxs for meds are written. Pt notified as he has to come get them( Oxycodone) & sign a pain med contract

## 2011-03-26 NOTE — Telephone Encounter (Signed)
Entered in error! Wrong chart. 

## 2011-03-27 ENCOUNTER — Other Ambulatory Visit: Payer: Self-pay | Admitting: Infectious Diseases

## 2011-03-27 DIAGNOSIS — B20 Human immunodeficiency virus [HIV] disease: Secondary | ICD-10-CM

## 2011-03-27 MED ORDER — EFAVIRENZ-EMTRICITAB-TENOFOVIR 600-200-300 MG PO TABS: 1.0000 | ORAL_TABLET | Freq: Every day | ORAL | Status: DC

## 2011-04-17 ENCOUNTER — Ambulatory Visit: Payer: BC Managed Care – PPO

## 2011-04-17 DIAGNOSIS — B20 Human immunodeficiency virus [HIV] disease: Secondary | ICD-10-CM

## 2011-04-17 DIAGNOSIS — Z79899 Other long term (current) drug therapy: Secondary | ICD-10-CM

## 2011-04-17 DIAGNOSIS — Z113 Encounter for screening for infections with a predominantly sexual mode of transmission: Secondary | ICD-10-CM

## 2011-04-17 LAB — LIPID PANEL
Cholesterol: 209 mg/dL — ABNORMAL HIGH (ref 0–200)
VLDL: 36 mg/dL (ref 0–40)

## 2011-04-18 LAB — CBC WITH DIFFERENTIAL/PLATELET
Eosinophils Relative: 3 % (ref 0–5)
HCT: 41.4 % (ref 39.0–52.0)
Lymphocytes Relative: 35 % (ref 12–46)
Lymphs Abs: 1.9 10*3/uL (ref 0.7–4.0)
MCV: 92.4 fL (ref 78.0–100.0)
Monocytes Absolute: 0.4 10*3/uL (ref 0.1–1.0)
Monocytes Relative: 8 % (ref 3–12)
RBC: 4.48 MIL/uL (ref 4.22–5.81)
WBC: 5.4 10*3/uL (ref 4.0–10.5)

## 2011-04-18 LAB — T-HELPER CELL (CD4) - (RCID CLINIC ONLY): CD4 % Helper T Cell: 37 % (ref 33–55)

## 2011-04-18 LAB — COMPLETE METABOLIC PANEL WITH GFR
ALT: 18 U/L (ref 0–53)
BUN: 15 mg/dL (ref 6–23)
CO2: 22 mEq/L (ref 19–32)
Calcium: 9 mg/dL (ref 8.4–10.5)
Chloride: 107 mEq/L (ref 96–112)
Creat: 0.92 mg/dL (ref 0.40–1.50)
GFR, Est African American: >60 mL/min (ref >60)

## 2011-04-18 LAB — RPR

## 2011-05-02 ENCOUNTER — Ambulatory Visit: Payer: BC Managed Care – PPO | Admitting: Infectious Diseases

## 2011-05-04 ENCOUNTER — Encounter: Payer: Self-pay | Admitting: Infectious Diseases

## 2011-05-04 ENCOUNTER — Ambulatory Visit (INDEPENDENT_AMBULATORY_CARE_PROVIDER_SITE_OTHER): Payer: BC Managed Care – PPO | Admitting: Infectious Diseases

## 2011-05-04 DIAGNOSIS — M1990 Unspecified osteoarthritis, unspecified site: Secondary | ICD-10-CM | POA: Insufficient documentation

## 2011-05-04 DIAGNOSIS — B20 Human immunodeficiency virus [HIV] disease: Secondary | ICD-10-CM

## 2011-05-04 DIAGNOSIS — M129 Arthropathy, unspecified: Secondary | ICD-10-CM

## 2011-05-04 DIAGNOSIS — E291 Testicular hypofunction: Secondary | ICD-10-CM

## 2011-05-04 DIAGNOSIS — E785 Hyperlipidemia, unspecified: Secondary | ICD-10-CM

## 2011-05-04 DIAGNOSIS — Z23 Encounter for immunization: Secondary | ICD-10-CM

## 2011-05-04 NOTE — Progress Notes (Signed)
Addended by: Wendall Mola on: 05/04/2011 10:18 AM   Modules accepted: Orders

## 2011-05-04 NOTE — Assessment & Plan Note (Signed)
Having pain (mild) and stiffness in his R hand. He is taking prn motrin. i asked that he take this with food.

## 2011-05-04 NOTE — Assessment & Plan Note (Signed)
Having some difficulty with applying the androgen gel. i have asked him to ask pharmacy if they have a different applicator.

## 2011-05-04 NOTE — H&P (Signed)
[place]  Patient:    Edward Hendricks, Edward Hendricks Visit Number: [id number] MRN: [id number]          Service Type: MED Location: [phone] Attending Physician:  Beatris Ship Dictated by:   Pearletha Furl Jacky Kindle, M.D. Admit Date:  10/06/2001                           History and Physical  CHIEF COMPLAINT:  Cough and shortness of breath.  HISTORY OF PRESENT ILLNESS:  The patient is a 50 year old white male with history of questionable sinusitis [facility], presenting at this time with vomiting, cough, and shortness of breath.  The patient has been in baseline health with no prior hospitalizations and chronic medical care.  He had an episode approximately one year ago of what he deemed to be a similar "sinus infection," treated by [facility] with total resolution.  Approximately three weeks ago, he was seen by [facility] and treated with Augmentin, with regards to which he had an allergic reaction and thus was changed to Tequin for what appeared to be a sinus infection. Somewhere in that period of time he also received some Biaxin.  Over the last two weeks he has had progressive shortness of breath, high fevers, and as of yesterday, vomiting, resulting in presentation to the emergency room.  He has had no mental status changes, no difficulty swallowing, no abdominal complaints, and no rash beyond the allergic reaction to what appeared to be Augmentin, which has subsequently cleared.  He denies any IV drug utilization, but clearly admits to oral sex and homosexual activity.  He has had a 20-22 pound weight loss in the last six months, which appears to be unintentional. He has had no subjective fevers or sweats prior to this event and prior to this event, denies any prior cough.  ALLERGIES:  AUGMENTIN.  CURRENT MEDICATIONS:  Tequin/Biaxin in OTCs.  MEDICAL ILLNESSES:  None.  SURGICAL ILLNESSES:   None.  FAMILY HISTORY:  Noncontributory.  SOCIAL HISTORY:  The patient currently works at [employer].  He does not drink alcohol or smoke.  He denies IV drug use.  PHYSICAL EXAMINATION:  VITAL SIGNS:  Temperature is 101.8, blood pressure 124/61, pulse 120 and regular, respiratory rate 28, shallow and labored.  GENERAL:  The patient is ill-appearing.  SKIN:  Skin is nondiaphoretic.  NECK:  No adenopathy.  Supple, nontender, no JVD or bruits.  HEENT:  Anicteric, good facial symmetry.  Oropharynx reveals thrush involving the buccal mucosa and uvula.  LUNGS:  Clear bilaterally.  CARDIOVASCULAR:  Was in tachycardia, regular, no murmur, gallop, rub, or heave.  ABDOMEN:  Soft, nontender.  No hepatosplenomegaly.  EXTREMITIES:  No cyanosis, clubbing, or edema.  GENITOURINARY/RECTAL:  Not done.  NEUROLOGIC:  Nonfocal.  Higher cortical functioning is intact.  DATA:  Chest x-ray per verbal report from the emergency room reveals interstitial changes compatible with PCP pneumonia.  Other lab work still pending.  ASSESSMENT: 1. Pneumonia, most likely compatible with PCP and high suspicion for human    immunodeficiency virus disease. 2. Oropharyngeal yeast, secondary to #1. 3. Hypoxemia, secondary to #1. 4. Volume depletion.  PLAN:  The patient will be admitted for IV Septra, Diflucan, hydration, and infectious disease consultation. Dictated by:   Pearletha Furl Jacky Kindle, M.D. Attending Physician:  Beatris Ship DD:  10/07/01 TD:  10/07/01 Job: [id number]

## 2011-05-04 NOTE — Assessment & Plan Note (Signed)
He is doing very well. Using condoms. PNVX updated today. Will continue his current rx. See him back in 5-6 months with labs prior.

## 2011-05-04 NOTE — Progress Notes (Signed)
  Subjective:    Patient ID: Edward Hendricks, male    DOB: 17-Apr-1961, 50 y.o.   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  CD4 720, VL <20, Chol 209 (04-17-11).  Feeling well, BP slightly elevated today.  No ha, cp, sob.      Review of Systems  Constitutional: Negative for appetite change and unexpected weight change.  Gastrointestinal: Negative for diarrhea and constipation.  Genitourinary: Negative for dysuria.  BP repeated and is 128/80     Objective:   Physical Exam  Constitutional: He appears well-developed and well-nourished.  Eyes: EOM are normal. Pupils are equal, round, and reactive to light.  Neck: Neck supple.  Cardiovascular: Normal rate, regular rhythm and normal heart sounds.   Pulmonary/Chest: Effort normal and breath sounds normal. No respiratory distress.  Abdominal: Soft. Bowel sounds are normal. He exhibits no distension. There is no tenderness.          Assessment & Plan:

## 2011-05-04 NOTE — Assessment & Plan Note (Signed)
Borderline.  Will continue to watch.  

## 2011-05-04 NOTE — Discharge Summary (Signed)
[place]  Patient:    Edward Hendricks, Edward Hendricks Visit Number: [id number] MRN: [id number]          Service Type: Attending:  Pearletha Furl. Jacky Kindle, M.D. Dictated by:   Pearletha Furl Jacky Kindle, M.D. Adm. Date:  10/07/01 Disc. Date: 10/13/01                             Discharge Summary  DISCHARGE DIAGNOSES: 1. Pneumocystis carinii pneumonia with hypoxemia. 2. Human immunodeficiency virus infection. 3. Oral Candidiasis.  HISTORY OF PRESENT ILLNESS:  Edward Hendricks is a 50 year old white male who presents through the emergency room without prior medical history with cough and shortness of breath.  He eventually has had homosexual behavior for approximately seven years and has been seen at [facility] approximately three weeks prior to this with presumed sinus infection. Despite multiple courses of antibiotics and one reaction to Augmentin, he had continued to decline with nausea and vomiting recently, about two to three weeks of cough, shortness of breath and several weeks of extensive weight loss.  He was admitted with what appeared to be bilateral pneumonia and volume depletion.  For the details see my dictated summary of October 07, 2001.  LABORATORY DATA:  HIV one antibody confirmation is positive.  CBC: Hemoglobin 11.6, hematocrit 33.8, white blood cell count 7.2, platelet count 283,000 with 91% segs, 6% lymphocytes.  Chemistries:  Sodium 131, potassium 4.3, chloride 100, CO2 22, glucose 125, BUN 10, creatinine 0.8, calcium 8.6.  Arterial blood gas:  pH 7.49, pCO2 32, pO2 68 on room air.  Bicarbonate equals 21.  HIV was reactive.  HIV RNA quantitative 35,000 copy per ml and 4.5 log 10.  Hepatitis B surface antigen is negative.  Hepatitis B cor antibody negative.  Hepatitis B surface antibody negative.  Hepatitis C antibody negative.  Blood culture times two negative.  RPR nonreactive.  T helper cell CD4s low at 20 cubic millimeters and helper percent low  at 5%.  Chest x-ray:  Diffuse bilateral infiltrative changes compatible with atypical pneumonia viral versus Pneumocystis pneumonia.  HOSPITAL COURSE:  The patient was admitted, placed on IV fluids, empiric Diflucan for oral thrush and IV Septra for presumed Pneumocystis pneumonia. The patient improved dramatically and was converted to an oral regimen which included Prednisone, Septra, Diflucan and prior to discharge had stable O2 saturations off of oxygen with no objective dyspnea and clear lung fields.  He was ambulating and independent in his ADLs. He was tolerating p.o. intake without nausea or vomiting and much stronger.  Infectious disease had been consulted and given returning serology, the patient was felt to have Pneumocystis pneumonia and underlying HIV infection.  This was discussed with him and case manager was involved to assist him with follow up care and ultimately disposition issues.  The patient is discharged in stable condition, improved on a regimen including Septra DS, Prednisone and Diflucan.  At the time of this dictation, infectious disease and case manager are arranging for patients outpatient needs.  He has been given a one month supply of all medications with it having been made clear that the patient will need to be followed up within 30 days as medications will expire at that point.  Arrangements with clinic and/or physician with appropriate expertise will be made and he will be seen by myself sooner as needed prior to such arrangements. Dictated by:   Pearletha Furl Jacky Kindle, M.D. Attending:  Pearletha Furl.  Jacky Kindle, M.D. DD:  10/13/01 TD:  10/14/01 Job: [id number]

## 2011-08-10 ENCOUNTER — Other Ambulatory Visit: Payer: Self-pay | Admitting: *Deleted

## 2011-08-10 DIAGNOSIS — E291 Testicular hypofunction: Secondary | ICD-10-CM

## 2011-08-10 MED ORDER — TESTOSTERONE 25 MG/2.5GM (1%) TD GEL: 1.0000 | Freq: Every day | TRANSDERMAL | Status: DC

## 2011-08-17 ENCOUNTER — Other Ambulatory Visit: Payer: Self-pay | Admitting: *Deleted

## 2011-08-21 ENCOUNTER — Telehealth: Payer: Self-pay | Admitting: *Deleted

## 2011-08-21 NOTE — Telephone Encounter (Signed)
Spoke with pharmacist about the dose. He has been on androgel 1% (5gm) gel packet since February of 2011, not the dose in med list. To md to clarify which dose he wants

## 2011-09-10 LAB — T-HELPER CELL (CD4) - (RCID CLINIC ONLY): CD4 T Cell Abs: 420

## 2011-09-18 ENCOUNTER — Other Ambulatory Visit: Payer: Self-pay | Admitting: Infectious Diseases

## 2011-09-18 ENCOUNTER — Other Ambulatory Visit: Payer: Self-pay | Admitting: *Deleted

## 2011-09-18 DIAGNOSIS — E291 Testicular hypofunction: Secondary | ICD-10-CM

## 2011-09-18 DIAGNOSIS — B20 Human immunodeficiency virus [HIV] disease: Secondary | ICD-10-CM

## 2011-09-18 MED ORDER — EFAVIRENZ-EMTRICITAB-TENOFOVIR 600-200-300 MG PO TABS: 1.0000 | ORAL_TABLET | Freq: Every day | ORAL | Status: DC

## 2011-09-18 MED ORDER — TESTOSTERONE 50 MG/5GM (1%) TD GEL: 5.0000 g | Freq: Every day | TRANSDERMAL | Status: DC

## 2011-09-25 ENCOUNTER — Other Ambulatory Visit: Payer: Self-pay | Admitting: *Deleted

## 2011-09-25 DIAGNOSIS — B20 Human immunodeficiency virus [HIV] disease: Secondary | ICD-10-CM

## 2011-09-25 LAB — T-HELPER CELL (CD4) - (RCID CLINIC ONLY): CD4 % Helper T Cell: 27 — ABNORMAL LOW

## 2011-09-25 MED ORDER — EFAVIRENZ-EMTRICITAB-TENOFOVIR 600-200-300 MG PO TABS: 1.0000 | ORAL_TABLET | Freq: Every day | ORAL | Status: DC

## 2011-10-02 ENCOUNTER — Other Ambulatory Visit: Payer: Self-pay | Admitting: *Deleted

## 2011-10-02 DIAGNOSIS — B20 Human immunodeficiency virus [HIV] disease: Secondary | ICD-10-CM

## 2011-10-02 MED ORDER — EFAVIRENZ-EMTRICITAB-TENOFOVIR 600-200-300 MG PO TABS: 1.0000 | ORAL_TABLET | Freq: Every day | ORAL | Status: DC

## 2011-11-07 ENCOUNTER — Other Ambulatory Visit (INDEPENDENT_AMBULATORY_CARE_PROVIDER_SITE_OTHER): Payer: BC Managed Care – PPO

## 2011-11-07 ENCOUNTER — Other Ambulatory Visit: Payer: Self-pay | Admitting: Infectious Diseases

## 2011-11-07 DIAGNOSIS — B20 Human immunodeficiency virus [HIV] disease: Secondary | ICD-10-CM

## 2011-11-07 LAB — T-HELPER CELL (CD4) - (RCID CLINIC ONLY): CD4 % Helper T Cell: 38 % (ref 33–55)

## 2011-11-07 LAB — COMPREHENSIVE METABOLIC PANEL
Albumin: 4.4 g/dL (ref 3.5–5.2)
Alkaline Phosphatase: 87 U/L (ref 39–117)
BUN: 15 mg/dL (ref 6–23)
CO2: 22 mEq/L (ref 19–32)
Calcium: 8.7 mg/dL (ref 8.4–10.5)
Glucose, Bld: 81 mg/dL (ref 70–99)
Potassium: 3.7 mEq/L (ref 3.5–5.3)

## 2011-11-07 LAB — CBC
Hemoglobin: 14.5 g/dL (ref 13.0–17.0)
RBC: 4.57 MIL/uL (ref 4.22–5.81)
WBC: 4.5 10*3/uL (ref 4.0–10.5)

## 2011-11-09 LAB — HIV-1 RNA QUANT-NO REFLEX-BLD: HIV 1 RNA Quant: NOT DETECTED copies/mL (ref <20)

## 2011-11-22 ENCOUNTER — Ambulatory Visit (INDEPENDENT_AMBULATORY_CARE_PROVIDER_SITE_OTHER): Payer: BC Managed Care – PPO | Admitting: Infectious Diseases

## 2011-11-22 ENCOUNTER — Encounter: Payer: Self-pay | Admitting: Infectious Diseases

## 2011-11-22 VITALS — BP 155/91 | HR 83 | Temp 99.1°F | Ht 69.0 in | Wt 201.0 lb

## 2011-11-22 DIAGNOSIS — Z79899 Other long term (current) drug therapy: Secondary | ICD-10-CM

## 2011-11-22 DIAGNOSIS — Z113 Encounter for screening for infections with a predominantly sexual mode of transmission: Secondary | ICD-10-CM

## 2011-11-22 DIAGNOSIS — E291 Testicular hypofunction: Secondary | ICD-10-CM

## 2011-11-22 DIAGNOSIS — B20 Human immunodeficiency virus [HIV] disease: Secondary | ICD-10-CM

## 2011-11-22 DIAGNOSIS — Z23 Encounter for immunization: Secondary | ICD-10-CM

## 2011-11-22 DIAGNOSIS — I10 Essential (primary) hypertension: Secondary | ICD-10-CM | POA: Insufficient documentation

## 2011-11-22 DIAGNOSIS — E785 Hyperlipidemia, unspecified: Secondary | ICD-10-CM

## 2011-11-22 NOTE — Assessment & Plan Note (Signed)
Will recheck at his f/u visit.  

## 2011-11-22 NOTE — Assessment & Plan Note (Signed)
He is doing very well. His CD4 has come down but relative to the last 3 years is stable. He is offered condoms (refuses). Gets flu shot today. Is Hep A immune. Will see him back in 5-6 months with labs prior.

## 2011-11-22 NOTE — Assessment & Plan Note (Signed)
Continues on his testosterone.

## 2011-11-22 NOTE — Assessment & Plan Note (Signed)
I offered to start him meds, he defers. Will work on diet and exercise.

## 2011-11-22 NOTE — Progress Notes (Signed)
  Subjective:    Patient ID: SCHNEIDER WARCHOL, male    DOB: July 05, 1961, 50 y.o.   MRN: [id number]  HPI 50 yo M with HIV+ (dx 2001), HSV and lipodystrophy. On atripla, his only rx. Using testosterone gel.   CD4 540, VL <20 (11-07-11), Chol 209 (04-17-11). Doing well, without complaints. Believes he has gained wt from thanksgiving holidays.     Review of Systems  Constitutional: Negative for fever, chills, appetite change and unexpected weight change.  Respiratory: Negative for shortness of breath.   Gastrointestinal: Negative for diarrhea and constipation.  Genitourinary: Negative for dysuria.       Objective:   Physical Exam  Constitutional: He appears well-developed and well-nourished.  Eyes: EOM are normal. Pupils are equal, round, and reactive to light.  Neck: Neck supple.  Cardiovascular: Normal rate, regular rhythm and normal heart sounds.   Pulmonary/Chest: Effort normal and breath sounds normal.  Abdominal: Soft. Bowel sounds are normal. There is no tenderness.  Lymphadenopathy:    He has no cervical adenopathy.          Assessment & Plan:

## 2012-03-17 ENCOUNTER — Other Ambulatory Visit: Payer: Self-pay | Admitting: *Deleted

## 2012-03-17 MED ORDER — TESTOSTERONE 50 MG/5GM (1%) TD GEL: 5.0000 g | Freq: Every day | TRANSDERMAL | Status: DC

## 2012-03-26 ENCOUNTER — Other Ambulatory Visit: Payer: Self-pay | Admitting: *Deleted

## 2012-03-26 DIAGNOSIS — Z113 Encounter for screening for infections with a predominantly sexual mode of transmission: Secondary | ICD-10-CM

## 2012-05-20 ENCOUNTER — Other Ambulatory Visit: Payer: BC Managed Care – PPO

## 2012-06-03 ENCOUNTER — Ambulatory Visit: Payer: BC Managed Care – PPO | Admitting: Infectious Diseases

## 2012-06-04 ENCOUNTER — Other Ambulatory Visit: Payer: BC Managed Care – PPO

## 2012-06-04 DIAGNOSIS — Z79899 Other long term (current) drug therapy: Secondary | ICD-10-CM

## 2012-06-04 DIAGNOSIS — B20 Human immunodeficiency virus [HIV] disease: Secondary | ICD-10-CM

## 2012-06-04 DIAGNOSIS — Z113 Encounter for screening for infections with a predominantly sexual mode of transmission: Secondary | ICD-10-CM

## 2012-06-04 LAB — COMPREHENSIVE METABOLIC PANEL
Albumin: 4.7 g/dL (ref 3.5–5.2)
Alkaline Phosphatase: 90 U/L (ref 39–117)
BUN: 16 mg/dL (ref 6–23)
Calcium: 9.2 mg/dL (ref 8.4–10.5)
Chloride: 107 mEq/L (ref 96–112)
Glucose, Bld: 90 mg/dL (ref 70–99)
Potassium: 4.1 mEq/L (ref 3.5–5.3)
Sodium: 140 mEq/L (ref 135–145)
Total Protein: 6.8 g/dL (ref 6.0–8.3)

## 2012-06-04 LAB — CBC
HCT: 43.9 % (ref 39.0–52.0)
Hemoglobin: 15.1 g/dL (ref 13.0–17.0)
MCHC: 34.4 g/dL (ref 30.0–36.0)
RBC: 4.78 MIL/uL (ref 4.22–5.81)
WBC: 6.8 10*3/uL (ref 4.0–10.5)

## 2012-06-04 LAB — LIPID PANEL
LDL Cholesterol: 145 mg/dL — ABNORMAL HIGH (ref 0–99)
Triglycerides: 188 mg/dL — ABNORMAL HIGH (ref <150)

## 2012-06-05 LAB — RPR

## 2012-06-05 LAB — T-HELPER CELL (CD4) - (RCID CLINIC ONLY): CD4 % Helper T Cell: 35 % (ref 33–55)

## 2012-06-06 LAB — HIV-1 RNA QUANT-NO REFLEX-BLD
HIV 1 RNA Quant: <20 copies/mL (ref <20)
HIV-1 RNA Quant, Log: <1.3 {Log} (ref <1.30)

## 2012-06-18 ENCOUNTER — Ambulatory Visit (INDEPENDENT_AMBULATORY_CARE_PROVIDER_SITE_OTHER): Payer: BC Managed Care – PPO | Admitting: Infectious Diseases

## 2012-06-18 ENCOUNTER — Encounter: Payer: Self-pay | Admitting: Infectious Diseases

## 2012-06-18 ENCOUNTER — Other Ambulatory Visit: Payer: Self-pay | Admitting: *Deleted

## 2012-06-18 VITALS — BP 134/89 | HR 80 | Temp 98.5°F | Ht 69.0 in | Wt 207.0 lb

## 2012-06-18 DIAGNOSIS — I10 Essential (primary) hypertension: Secondary | ICD-10-CM

## 2012-06-18 DIAGNOSIS — E785 Hyperlipidemia, unspecified: Secondary | ICD-10-CM

## 2012-06-18 DIAGNOSIS — M1990 Unspecified osteoarthritis, unspecified site: Secondary | ICD-10-CM

## 2012-06-18 DIAGNOSIS — B20 Human immunodeficiency virus [HIV] disease: Secondary | ICD-10-CM

## 2012-06-18 DIAGNOSIS — M129 Arthropathy, unspecified: Secondary | ICD-10-CM

## 2012-06-18 DIAGNOSIS — E291 Testicular hypofunction: Secondary | ICD-10-CM

## 2012-06-18 DIAGNOSIS — Z113 Encounter for screening for infections with a predominantly sexual mode of transmission: Secondary | ICD-10-CM

## 2012-06-18 MED ORDER — TESTOSTERONE 20.25 MG/ACT (1.62%) TD GEL: 4.0000 "application " | Freq: Every day | TRANSDERMAL | Status: DC

## 2012-06-18 MED ORDER — MELOXICAM 15 MG PO TABS: 15.0000 mg | ORAL_TABLET | Freq: Every day | ORAL | Status: DC

## 2012-06-18 NOTE — Progress Notes (Signed)
  Subjective:    Patient ID: Edward Hendricks, male    DOB: Nov 09, 1961, 51 y.o.   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 On atripla. Using testosterone gel.  Turns 51 this month, no special plans.  Feels like he is having more hand/joint pains. Previously had R shoulder spurs, was prev given meloxicam. Would like refill. Wt up ~ 10#, exercising less.   HIV 1 RNA Quant (copies/mL)  Date Value  06/04/2012 <20   11/07/2011 NOT DETECTED   04/17/2011 <20      CD4 T Cell Abs (cmm)  Date Value  06/04/2012 730   11/07/2011 540   04/17/2011 720    Lab Results  Component Value Date   CHOL 223* 06/04/2012   HDL 40 06/04/2012   LDLCALC 145* 06/04/2012   TRIG 188* 06/04/2012   CHOLHDL 5.6 06/04/2012         Review of Systems  Constitutional: Negative for appetite change and unexpected weight change.  Gastrointestinal: Negative for diarrhea and constipation.  Genitourinary: Negative for dysuria.       Objective:   Physical Exam  Constitutional: He appears well-developed and well-nourished.  HENT:  Mouth/Throat: No oropharyngeal exudate.  Eyes: EOM are normal. Pupils are equal, round, and reactive to light.  Neck: Neck supple.  Cardiovascular: Normal rate, regular rhythm and normal heart sounds.   Pulmonary/Chest: Effort normal and breath sounds normal.  Abdominal: Soft. Bowel sounds are normal. He exhibits no distension. There is no tenderness.  Lymphadenopathy:    He has no cervical adenopathy.          Assessment & Plan:

## 2012-06-18 NOTE — Assessment & Plan Note (Signed)
Offered to start him on lipid lowering therapy. He wants to try diet and exercise. Believes his exercise regimen will improve with anti-inflammatory. May also be a function of his testosterone replacement.

## 2012-06-18 NOTE — Assessment & Plan Note (Addendum)
He is doing very well. Offered/refused condoms. Will see him back in 6 months with labs prior. Wants to defer getting colonoscopy scheduled til next visit.

## 2012-06-18 NOTE — Assessment & Plan Note (Signed)
Would like refill of meloxicam. Will watch and see if this improves.

## 2012-06-18 NOTE — Assessment & Plan Note (Signed)
Normotensive today.

## 2012-06-18 NOTE — Assessment & Plan Note (Signed)
Pt requests change in his testosterone due to having difficulty with his tube clogging. Will check f/u testosterone level.

## 2012-07-28 ENCOUNTER — Other Ambulatory Visit: Payer: Self-pay | Admitting: Infectious Diseases

## 2012-12-15 ENCOUNTER — Other Ambulatory Visit: Payer: Self-pay | Admitting: Infectious Diseases

## 2013-01-01 ENCOUNTER — Other Ambulatory Visit: Payer: Self-pay | Admitting: *Deleted

## 2013-01-01 DIAGNOSIS — B20 Human immunodeficiency virus [HIV] disease: Secondary | ICD-10-CM

## 2013-01-01 MED ORDER — EFAVIRENZ-EMTRICITAB-TENOFOVIR 600-200-300 MG PO TABS: 1.0000 | ORAL_TABLET | Freq: Every day | ORAL | Status: DC

## 2013-01-06 ENCOUNTER — Ambulatory Visit: Payer: BC Managed Care – PPO

## 2013-01-06 ENCOUNTER — Other Ambulatory Visit (INDEPENDENT_AMBULATORY_CARE_PROVIDER_SITE_OTHER): Payer: PRIVATE HEALTH INSURANCE

## 2013-01-06 DIAGNOSIS — E291 Testicular hypofunction: Secondary | ICD-10-CM

## 2013-01-06 DIAGNOSIS — E785 Hyperlipidemia, unspecified: Secondary | ICD-10-CM

## 2013-01-06 DIAGNOSIS — B20 Human immunodeficiency virus [HIV] disease: Secondary | ICD-10-CM

## 2013-01-06 DIAGNOSIS — Z113 Encounter for screening for infections with a predominantly sexual mode of transmission: Secondary | ICD-10-CM

## 2013-01-06 LAB — CBC
MCH: 31.8 pg (ref 26.0–34.0)
MCHC: 35.6 g/dL (ref 30.0–36.0)
Platelets: 135 10*3/uL — ABNORMAL LOW (ref 150–400)

## 2013-01-07 LAB — HIV-1 RNA QUANT-NO REFLEX-BLD: HIV 1 RNA Quant: <20 copies/mL (ref <20)

## 2013-01-07 LAB — LIPID PANEL
Total CHOL/HDL Ratio: 5.4 Ratio
VLDL: 22 mg/dL (ref 0–40)

## 2013-01-07 LAB — COMPREHENSIVE METABOLIC PANEL
AST: 18 U/L (ref 0–37)
Albumin: 4.5 g/dL (ref 3.5–5.2)
Alkaline Phosphatase: 89 U/L (ref 39–117)
Potassium: 3.9 mEq/L (ref 3.5–5.3)
Sodium: 138 mEq/L (ref 135–145)
Total Protein: 6.6 g/dL (ref 6.0–8.3)

## 2013-01-07 LAB — RPR

## 2013-01-07 LAB — TESTOSTERONE, FREE, TOTAL, SHBG: Testosterone: 547.82 ng/dL (ref 300–890)

## 2013-01-20 ENCOUNTER — Encounter: Payer: Self-pay | Admitting: Internal Medicine

## 2013-01-20 ENCOUNTER — Ambulatory Visit (INDEPENDENT_AMBULATORY_CARE_PROVIDER_SITE_OTHER): Payer: PRIVATE HEALTH INSURANCE | Admitting: Infectious Diseases

## 2013-01-20 ENCOUNTER — Encounter: Payer: Self-pay | Admitting: Infectious Diseases

## 2013-01-20 ENCOUNTER — Ambulatory Visit: Payer: BC Managed Care – PPO | Admitting: Infectious Diseases

## 2013-01-20 VITALS — BP 158/100 | HR 83 | Temp 98.1°F | Ht 69.0 in | Wt 208.0 lb

## 2013-01-20 DIAGNOSIS — Z23 Encounter for immunization: Secondary | ICD-10-CM

## 2013-01-20 DIAGNOSIS — B20 Human immunodeficiency virus [HIV] disease: Secondary | ICD-10-CM

## 2013-01-20 DIAGNOSIS — E785 Hyperlipidemia, unspecified: Secondary | ICD-10-CM

## 2013-01-20 DIAGNOSIS — M129 Arthropathy, unspecified: Secondary | ICD-10-CM

## 2013-01-20 DIAGNOSIS — M1990 Unspecified osteoarthritis, unspecified site: Secondary | ICD-10-CM

## 2013-01-20 DIAGNOSIS — I10 Essential (primary) hypertension: Secondary | ICD-10-CM

## 2013-01-20 NOTE — Progress Notes (Signed)
  Subjective:    Patient ID: Edward Hendricks, male    DOB: 09/11/1961, 52 y.o.   MRN: [id number]  HPI 52 yo M with HIV+ (dx 2001), HSV and lipodystrophy. On atripla. Using testosterone gel.  C/o joint popping and cracking. Occasionally painful. L Knee, R hip. Has had numbness down his R leg after laying down for a long time. No previous radiographs.   HIV 1 RNA Quant (copies/mL)  Date Value  01/06/2013 <20   06/04/2012 <20   11/07/2011 NOT DETECTED      CD4 T Cell Abs (cmm)  Date Value  01/06/2013 580   06/04/2012 730   11/07/2011 540       Review of Systems     Objective:   Physical Exam  Constitutional: He appears well-developed and well-nourished.  HENT:  Mouth/Throat: No oropharyngeal exudate.  Eyes: EOM are normal. Pupils are equal, round, and reactive to light.  Neck: Neck supple.  Cardiovascular: Normal rate, regular rhythm and normal heart sounds.   Pulmonary/Chest: Effort normal and breath sounds normal.  Abdominal: Soft. Bowel sounds are normal. He exhibits no distension. There is no tenderness.  Musculoskeletal:       Arms:      Legs: Lymphadenopathy:    He has no cervical adenopathy.          Assessment & Plan:

## 2013-01-20 NOTE — Assessment & Plan Note (Signed)
Cont to be mildly elevated. Will watch.

## 2013-01-20 NOTE — Assessment & Plan Note (Signed)
He is doing very well. Needs colonoscopy. Offered refused condoms. vax are up to date as of today. Needs refferal to GI for colonoscopy. Will see him back in 6 months.

## 2013-01-20 NOTE — Assessment & Plan Note (Signed)
Elevated today. Will ask him to home monitor. Start medication at next visit if continues to be elevated. Could this be from NSIADS from [facility]?

## 2013-01-20 NOTE — Assessment & Plan Note (Signed)
Offered to refer him to rheum, he wishes to defer. 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 I explained this to him.

## 2013-01-22 ENCOUNTER — Other Ambulatory Visit: Payer: Self-pay | Admitting: Infectious Diseases

## 2013-01-22 DIAGNOSIS — E291 Testicular hypofunction: Secondary | ICD-10-CM

## 2013-01-22 MED ORDER — TESTOSTERONE 20.25 MG/ACT (1.62%) TD GEL: 4.0000 "application " | Freq: Every day | TRANSDERMAL | Status: DC

## 2013-01-22 NOTE — Addendum Note (Signed)
Addended by: Jennet Maduro D on: 01/22/2013 02:55 PM   Modules accepted: Orders

## 2013-01-22 NOTE — Telephone Encounter (Deleted)
MD, please review Androgel rx for accuracy prior to sending refill.  Pt requesting refill.

## 2013-01-27 ENCOUNTER — Other Ambulatory Visit: Payer: Self-pay | Admitting: Infectious Diseases

## 2013-01-27 DIAGNOSIS — M25551 Pain in right hip: Secondary | ICD-10-CM

## 2013-01-30 ENCOUNTER — Telehealth: Payer: Self-pay | Admitting: *Deleted

## 2013-01-30 NOTE — Telephone Encounter (Signed)
Obtained prior authorization for Androgel, [id number]. Approval faxed to [facility]. Wendall Mola

## 2013-02-25 ENCOUNTER — Ambulatory Visit (AMBULATORY_SURGERY_CENTER): Payer: PRIVATE HEALTH INSURANCE

## 2013-02-25 VITALS — Ht 69.0 in | Wt 211.0 lb

## 2013-02-25 DIAGNOSIS — Z1211 Encounter for screening for malignant neoplasm of colon: Secondary | ICD-10-CM

## 2013-02-25 MED ORDER — NA SULFATE-K SULFATE-MG SULF 17.5-3.13-1.6 GM/177ML PO SOLN: 1.0000 | Freq: Once | ORAL | Status: DC

## 2013-02-27 ENCOUNTER — Encounter: Payer: Self-pay | Admitting: Internal Medicine

## 2013-03-10 ENCOUNTER — Ambulatory Visit (AMBULATORY_SURGERY_CENTER): Payer: PRIVATE HEALTH INSURANCE | Admitting: Internal Medicine

## 2013-03-10 ENCOUNTER — Encounter: Payer: Self-pay | Admitting: Internal Medicine

## 2013-03-10 VITALS — BP 143/88 | HR 85 | Temp 98.4°F | Resp 20 | Ht 69.0 in | Wt 211.0 lb

## 2013-03-10 DIAGNOSIS — Z860100 Personal history of colon polyps, unspecified: Secondary | ICD-10-CM

## 2013-03-10 DIAGNOSIS — D126 Benign neoplasm of colon, unspecified: Secondary | ICD-10-CM

## 2013-03-10 DIAGNOSIS — Z1211 Encounter for screening for malignant neoplasm of colon: Secondary | ICD-10-CM

## 2013-03-10 DIAGNOSIS — K5730 Diverticulosis of large intestine without perforation or abscess without bleeding: Secondary | ICD-10-CM

## 2013-03-10 HISTORY — DX: Personal history of colonic polyps: Z86.010

## 2013-03-10 HISTORY — DX: Personal history of colon polyps, unspecified: Z86.0100

## 2013-03-10 MED ORDER — SODIUM CHLORIDE 0.9 % IV SOLN: 500.0000 mL | INTRAVENOUS | Status: DC

## 2013-03-10 NOTE — Progress Notes (Signed)
Lidocaine-40mg IV prior to Propofol InductionPropofol given over incremental dosages 

## 2013-03-10 NOTE — Patient Instructions (Addendum)
Four small polyps were found and removed. Not to worry - they look benign.   You also have diverticulosis - not usually a problem. You will receive a handout about this.  I will let you know pathology results and when to have another routine colonoscopy by mail.  Thank you for choosing me and [facility].  Iva Boop, MD, FACG     YOU HAD AN ENDOSCOPIC PROCEDURE TODAY AT THE  ENDOSCOPY CENTER: Refer to the procedure report that was given to you for any specific questions about what was found during the examination.  If the procedure report does not answer your questions, please call your gastroenterologist to clarify.  If you requested that your care partner not be given the details of your procedure findings, then the procedure report has been included in a sealed envelope for you to review at your convenience later.  YOU SHOULD EXPECT: Some feelings of bloating in the abdomen. Passage of more gas than usual.  Walking can help get rid of the air that was put into your GI tract during the procedure and reduce the bloating. If you had a lower endoscopy (such as a colonoscopy or flexible sigmoidoscopy) you may notice spotting of blood in your stool or on the toilet paper. If you underwent a bowel prep for your procedure, then you may not have a normal bowel movement for a few days.  DIET: Your first meal following the procedure should be a light meal and then it is ok to progress to your normal diet.  A half-sandwich or bowl of soup is an example of a good first meal.  Heavy or fried foods are harder to digest and may make you feel nauseous or bloated.  Likewise meals heavy in dairy and vegetables can cause extra gas to form and this can also increase the bloating.  Drink plenty of fluids but you should avoid alcoholic beverages for 24 hours.  ACTIVITY: Your care partner should take you home directly after the procedure.  You should plan to take it easy, moving slowly for  the rest of the day.  You can resume normal activity the day after the procedure however you should NOT DRIVE or use heavy machinery for 24 hours (because of the sedation medicines used during the test).    SYMPTOMS TO REPORT IMMEDIATELY: A gastroenterologist can be reached at any hour.  During normal business hours, 8:30 AM to 5:00 PM Monday through Friday, call [phone].  After hours and on weekends, please call the GI answering service at [phone] who will take a message and have the physician on call contact you.   Following lower endoscopy (colonoscopy or flexible sigmoidoscopy):  Excessive amounts of blood in the stool  Significant tenderness or worsening of abdominal pains  Swelling of the abdomen that is new, acute  Fever of 100F or higher    FOLLOW UP: If any biopsies were taken you will be contacted by phone or by letter within the next 1-3 weeks.  Call your gastroenterologist if you have not heard about the biopsies in 3 weeks.  Our staff will call the home number listed on your records the next business day following your procedure to check on you and address any questions or concerns that you may have at that time regarding the information given to you following your procedure. This is a courtesy call and so if there is no answer at the home number and we have not heard from you  through the emergency physician on call, we will assume that you have returned to your regular daily activities without incident.  SIGNATURES/CONFIDENTIALITY: You and/or your care partner have signed paperwork which will be entered into your electronic medical record.  These signatures attest to the fact that that the information above on your After Visit Summary has been reviewed and is understood.  Full responsibility of the confidentiality of this discharge information lies with you and/or your care-partner.

## 2013-03-10 NOTE — Op Note (Signed)
[facility] [address] [place], [address]   COLONOSCOPY PROCEDURE REPORT  PATIENT: Edward Hendricks, Edward Hendricks  MR#: [id number] BIRTHDATE: 1961-09-09 , 51  yrs. old GENDER: Male ENDOSCOPIST: Iva Boop, MD, [facility] REFERRED QM:[id number] Hatcher, M.D. PROCEDURE DATE:  03/10/2013 PROCEDURE:   Colonoscopy with snare polypectomy ASA CLASS:   Class II INDICATIONS:average risk screening. MEDICATIONS: propofol (Diprivan) 350mg  IV, MAC sedation, administered by CRNA, and These medications were titrated to patient response per physician's verbal order  DESCRIPTION OF PROCEDURE:   After the risks benefits and alternatives of the procedure were thoroughly explained, informed consent was obtained.  A digital rectal exam revealed no abnormalities of the rectum, A digital rectal exam revealed the prostate was not enlarged, and A digital rectal exam revealed no prostatic nodules.   The LB [id number]  endoscope was introduced through the anus and advanced to the cecum, which was identified by both the appendix and ileocecal valve. No adverse events experienced.   The quality of the prep was Suprep excellent The instrument was then slowly withdrawn as the colon was fully examined.      COLON FINDINGS: Four diminutive polypoid shaped and smooth sessile polyps were found in the transverse colon, descending colon, and sigmoid colon.  A polypectomy was performed with a cold snare.  The resection was complete and the polyp tissue was completely retrieved.   Mild diverticulosis was noted in the sigmoid colon. The colon mucosa was otherwise normal.   A right colon retroflexion was performed.  Retroflexed views revealed no abnormalities. The time to cecum=1 minutes 12 seconds.  Withdrawal time=12 minutes 05 seconds.  The scope was withdrawn and the procedure completed. COMPLICATIONS: There were no complications.  ENDOSCOPIC IMPRESSION: 1.   Four diminutive sessile polyps were  found in the transverse colon, descending colon, and sigmoid colon; polypectomy was performed with a cold snare 2.   Mild diverticulosis was noted in the sigmoid colon 3.   The colon mucosa was otherwise normal - excellent prep  RECOMMENDATIONS: Timing of repeat colonoscopy will be determined by pathology findings.   eSigned:  Iva Boop, MD, [facility] 03/10/2013 10:53 AM   cc: Johny Sax, MD and The Patient   PATIENT NAME:  Edward Hendricks, Edward Hendricks MR#: [id number]

## 2013-03-10 NOTE — Progress Notes (Signed)
No complaints noted in the recovery room. Maw  Patient did not experience any of the following events: a burn prior to discharge; a fall within the facility; wrong site/side/patient/procedure/implant event; or a hospital transfer or hospital admission upon discharge from the facility. (G8907) Patient did not have preoperative order for IV antibiotic SSI prophylaxis. (G8918)  

## 2013-03-11 ENCOUNTER — Telehealth: Payer: Self-pay

## 2013-03-11 NOTE — Telephone Encounter (Signed)
  Follow up Call-  Call back number 03/10/2013  Post procedure Call Back phone  # [phone]  Permission to leave phone message Yes     Patient questions:  Do you have a fever, pain , or abdominal swelling? yes had temp of 99.8 last night but normal this am Pain Score  0 *  Have you tolerated food without any problems? yes  Have you been able to return to your normal activities? yes  Do you have any questions about your discharge instructions: Diet   no Medications  no Follow up visit  no  Do you have questions or concerns about your Care? no  Actions: * If pain score is 4 or above: No action needed, pain <4.

## 2013-03-18 ENCOUNTER — Encounter: Payer: Self-pay | Admitting: Internal Medicine

## 2013-03-18 NOTE — Progress Notes (Signed)
Quick Note:  4 diminutive adenomas - repeat colonoscopy 3 years 02/2016 ______

## 2013-04-02 ENCOUNTER — Telehealth: Payer: Self-pay | Admitting: Internal Medicine

## 2013-04-02 ENCOUNTER — Other Ambulatory Visit (INDEPENDENT_AMBULATORY_CARE_PROVIDER_SITE_OTHER): Payer: PRIVATE HEALTH INSURANCE

## 2013-04-02 ENCOUNTER — Encounter: Payer: Self-pay | Admitting: Nurse Practitioner

## 2013-04-02 ENCOUNTER — Ambulatory Visit (INDEPENDENT_AMBULATORY_CARE_PROVIDER_SITE_OTHER): Payer: PRIVATE HEALTH INSURANCE | Admitting: Nurse Practitioner

## 2013-04-02 VITALS — BP 150/80 | HR 74 | Ht 69.0 in | Wt 206.8 lb

## 2013-04-02 DIAGNOSIS — R109 Unspecified abdominal pain: Secondary | ICD-10-CM

## 2013-04-02 DIAGNOSIS — K5900 Constipation, unspecified: Secondary | ICD-10-CM

## 2013-04-02 DIAGNOSIS — K5730 Diverticulosis of large intestine without perforation or abscess without bleeding: Secondary | ICD-10-CM

## 2013-04-02 LAB — CBC WITH DIFFERENTIAL/PLATELET
Basophils Absolute: 0 10*3/uL (ref 0.0–0.1)
Eosinophils Relative: 2.4 % (ref 0.0–5.0)
Lymphs Abs: 2.1 10*3/uL (ref 0.7–4.0)
Monocytes Relative: 6.2 % (ref 3.0–12.0)
Neutrophils Relative %: 55.1 % (ref 43.0–77.0)
Platelets: 183 10*3/uL (ref 150.0–400.0)
RDW: 12.9 % (ref 11.5–14.6)
WBC: 5.9 10*3/uL (ref 4.5–10.5)

## 2013-04-02 MED ORDER — METRONIDAZOLE 250 MG PO TABS: 250.0000 mg | ORAL_TABLET | Freq: Three times a day (TID) | ORAL | Status: DC

## 2013-04-02 MED ORDER — CIPROFLOXACIN HCL 500 MG PO TABS: 250.0000 mg | ORAL_TABLET | Freq: Two times a day (BID) | ORAL | Status: DC

## 2013-04-02 MED ORDER — TRAMADOL HCL 50 MG PO TABS: 50.0000 mg | ORAL_TABLET | Freq: Four times a day (QID) | ORAL | Status: DC | PRN

## 2013-04-02 NOTE — Telephone Encounter (Signed)
Patient reports that he has pain on his left side, about 2 inches below ribcage.  He reports pain worsening over the last 2 weeks.  Describes as a cramping "deep down pain".  Patient did have diverticulosis on colon 03/10/13.  He will come in today and see Willette Cluster RNP at 3:00

## 2013-04-02 NOTE — Progress Notes (Signed)
04/02/2013 Edward Hendricks [id number] 1961-05-31   History of Present Illness:  Patient is a 52 year old male who underwent screening colonoscopy 03/10/13 with findings of mild diverticulosis and 4 polyps, two of which were tubular adenomas. Since the colonoscopy patient has had left mid abdominal pain which has gotten worse over the last few days. This pain was not present prior to colonoscopy. It is worse with movement, possibly worse with meals. He initiallly thought pain was related to dietary changes. He then thought maybe it was muscular. No nausea. BMs are not as regular. He is going less often, smaller volume but doesn't feel constipated.  .  On mobic daily for joint pain for about a year. He has been taking Advil since colonoscopy  Current Medications, Allergies, Past Medical History, Past Surgical History, Family History and Social History were reviewed in [employer] record.   Physical Exam: General: Well developed , white male in no acute distress Head: Normocephalic and atraumatic Eyes:  sclerae anicteric, conjunctiva pink  Ears: Normal auditory acuity Lungs: Clear throughout to auscultation Heart: Regular rate and rhythm Abdomen: Soft, non distended, mild tenderness left mid abdomen. No masses, no hepatomegaly. Normal bowel sounds. Negative Carnett's. Musculoskeletal: Symmetrical with no gross deformities  Extremities: No edema  Neurological: Alert oriented x 4, grossly nonfocal Psychological:  Alert and cooperative. Normal mood and affect  Assessment and Recommendations:  Left mid abdominal pain present since colonoscopy late last month. This may be secondary to constipation or maybe mild diverticulitis though atypical presentation.  Musculoskeletal pain not excluded. Will obtain basic labs, treat constipation. The office will be closed tomorrow and over weekend so will call in antibiotics for patient to start if pain persists after resolution of  constipation. Ultram for pain. . Will call him early next week for condition update.

## 2013-04-02 NOTE — Patient Instructions (Addendum)
Your physician has requested that you go to the basement for the following lab work before leaving today: CBC We have sent the following medications to your pharmacy for you to pick up at your convenience: Ultram, Cipro and Flagyl Take Miralax over the counter three times daily until you have a good bowel movement.  If pain is no better after the bowel movement start the antibiotic. Call on Monday and give [place] a condition update. CC:  Johny Sax MD

## 2013-04-07 ENCOUNTER — Telehealth: Payer: Self-pay | Admitting: Internal Medicine

## 2013-04-07 NOTE — Telephone Encounter (Signed)
Patient wanted Willette Cluster RNP to know he is no longer needing pain meds for his abdominal pain.  He is feeling much better

## 2013-04-08 ENCOUNTER — Other Ambulatory Visit: Payer: Self-pay | Admitting: Infectious Diseases

## 2013-04-09 NOTE — Progress Notes (Signed)
Agree with Ms. Guenther's assessment and plan. Carl E. Gessner, MD, FACG   

## 2013-07-27 ENCOUNTER — Other Ambulatory Visit: Payer: Self-pay | Admitting: Infectious Diseases

## 2013-07-27 DIAGNOSIS — E291 Testicular hypofunction: Secondary | ICD-10-CM

## 2013-08-14 ENCOUNTER — Other Ambulatory Visit: Payer: Self-pay | Admitting: Infectious Diseases

## 2013-08-14 DIAGNOSIS — B20 Human immunodeficiency virus [HIV] disease: Secondary | ICD-10-CM

## 2013-09-07 ENCOUNTER — Other Ambulatory Visit: Payer: PRIVATE HEALTH INSURANCE

## 2013-09-07 DIAGNOSIS — E785 Hyperlipidemia, unspecified: Secondary | ICD-10-CM

## 2013-09-07 DIAGNOSIS — B20 Human immunodeficiency virus [HIV] disease: Secondary | ICD-10-CM

## 2013-09-07 LAB — CBC
Hemoglobin: 14.7 g/dL (ref 13.0–17.0)
Platelets: 181 10*3/uL (ref 150–400)
RBC: 4.58 MIL/uL (ref 4.22–5.81)
WBC: 5.6 10*3/uL (ref 4.0–10.5)

## 2013-09-08 LAB — COMPREHENSIVE METABOLIC PANEL
ALT: 27 U/L (ref 0–53)
Albumin: 4.4 g/dL (ref 3.5–5.2)
Alkaline Phosphatase: 85 U/L (ref 39–117)
CO2: 24 mEq/L (ref 19–32)
Glucose, Bld: 87 mg/dL (ref 70–99)
Potassium: 4 mEq/L (ref 3.5–5.3)
Sodium: 139 mEq/L (ref 135–145)
Total Protein: 6.6 g/dL (ref 6.0–8.3)

## 2013-09-08 LAB — LIPID PANEL
Cholesterol: 226 mg/dL — ABNORMAL HIGH (ref 0–200)
LDL Cholesterol: 150 mg/dL — ABNORMAL HIGH (ref 0–99)
Triglycerides: 194 mg/dL — ABNORMAL HIGH (ref <150)

## 2013-09-08 LAB — HIV-1 RNA QUANT-NO REFLEX-BLD
HIV 1 RNA Quant: <20 copies/mL (ref <20)
HIV-1 RNA Quant, Log: <1.3 {Log} (ref <1.30)

## 2013-09-08 LAB — T-HELPER CELL (CD4) - (RCID CLINIC ONLY): CD4 % Helper T Cell: 38 % (ref 33–55)

## 2013-09-21 ENCOUNTER — Other Ambulatory Visit: Payer: Self-pay | Admitting: Infectious Diseases

## 2013-09-21 ENCOUNTER — Ambulatory Visit (INDEPENDENT_AMBULATORY_CARE_PROVIDER_SITE_OTHER): Payer: PRIVATE HEALTH INSURANCE | Admitting: Infectious Diseases

## 2013-09-21 ENCOUNTER — Ambulatory Visit
Admission: RE | Admit: 2013-09-21 | Discharge: 2013-09-21 | Disposition: A | Discharge status: Home / Self Care | Payer: PRIVATE HEALTH INSURANCE | Source: Ambulatory Visit | Attending: Infectious Diseases | Admitting: Infectious Diseases

## 2013-09-21 ENCOUNTER — Encounter: Payer: Self-pay | Admitting: Infectious Diseases

## 2013-09-21 VITALS — BP 152/89 | HR 79 | Temp 98.2°F | Ht 69.0 in | Wt 208.0 lb

## 2013-09-21 DIAGNOSIS — I10 Essential (primary) hypertension: Secondary | ICD-10-CM

## 2013-09-21 DIAGNOSIS — Z23 Encounter for immunization: Secondary | ICD-10-CM

## 2013-09-21 DIAGNOSIS — Z113 Encounter for screening for infections with a predominantly sexual mode of transmission: Secondary | ICD-10-CM

## 2013-09-21 DIAGNOSIS — B20 Human immunodeficiency virus [HIV] disease: Secondary | ICD-10-CM

## 2013-09-21 DIAGNOSIS — M129 Arthropathy, unspecified: Secondary | ICD-10-CM

## 2013-09-21 DIAGNOSIS — M1990 Unspecified osteoarthritis, unspecified site: Secondary | ICD-10-CM

## 2013-09-21 DIAGNOSIS — Z79899 Other long term (current) drug therapy: Secondary | ICD-10-CM

## 2013-09-21 NOTE — Assessment & Plan Note (Signed)
Will send him for plain film of R hip. Needs MRI.

## 2013-09-21 NOTE — Assessment & Plan Note (Signed)
He is doing well. Needs to exercise, watch diet. Gets flu and tet vax today. Refuses dental. Offered/refused condoms.  rtc 6 months.

## 2013-09-21 NOTE — Assessment & Plan Note (Signed)
Checks BP at home 130/90-80.

## 2013-09-21 NOTE — Addendum Note (Signed)
Addended by: Wendall Mola A on: 09/21/2013 02:47 PM   Modules accepted: Orders

## 2013-09-21 NOTE — Progress Notes (Signed)
  Subjective:    Patient ID: Edward Hendricks, male    DOB: 08/09/1961, 52 y.o.   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 Gets TET and Flu vax today.  Has been out of wt loss plan for last 6 months.  After colonoscopy has had change in his BM.  Still having R hip pain- worse with standing still for a prolonged period. Having R foot numbness also.   HIV 1 RNA Quant (copies/mL)  Date Value  09/07/2013 <20   01/06/2013 <20   06/04/2012 <20      CD4 T Cell Abs (/uL)  Date Value  09/07/2013 720   01/06/2013 580   06/04/2012 730     Review of Systems  Constitutional: Negative for appetite change and unexpected weight change.  Gastrointestinal: Negative for abdominal pain, diarrhea and constipation.  Genitourinary: Negative for dysuria.  Musculoskeletal: Positive for arthralgias.       Objective:   Physical Exam  Constitutional: He appears well-developed and well-nourished.  HENT:  Mouth/Throat: No oropharyngeal exudate.  Eyes: EOM are normal. Pupils are equal, round, and reactive to light.  Neck: Neck supple.  Cardiovascular: Normal rate, regular rhythm and normal heart sounds.   Pulmonary/Chest: Effort normal and breath sounds normal.  Abdominal: Soft. Bowel sounds are normal. He exhibits no distension. There is no tenderness.  Musculoskeletal:       Legs: Lymphadenopathy:    He has no cervical adenopathy.          Assessment & Plan:

## 2014-01-04 ENCOUNTER — Telehealth: Payer: Self-pay | Admitting: *Deleted

## 2014-01-04 ENCOUNTER — Other Ambulatory Visit: Payer: Self-pay | Admitting: Infectious Diseases

## 2014-01-04 DIAGNOSIS — E291 Testicular hypofunction: Secondary | ICD-10-CM

## 2014-01-04 DIAGNOSIS — M1990 Unspecified osteoarthritis, unspecified site: Secondary | ICD-10-CM

## 2014-01-04 NOTE — Telephone Encounter (Signed)
Phoned in.

## 2014-01-04 NOTE — Telephone Encounter (Signed)
Patient requesting refill of Testosterone 1.62% and Mobic 15mg .  Per chart review, you wanted to follow patient's labs to see if the dosing needed to be changed. Patient hasn't refilled regularly: Testosterone since August 2014(150 g R1) and Mobic since April 2014 (#90, R1).  Ok to refill? Landis Gandy, RN

## 2014-01-04 NOTE — Telephone Encounter (Signed)
Ok, thanks.

## 2014-02-08 ENCOUNTER — Other Ambulatory Visit: Payer: Self-pay | Admitting: Infectious Diseases

## 2014-03-23 ENCOUNTER — Other Ambulatory Visit: Payer: PRIVATE HEALTH INSURANCE

## 2014-03-23 DIAGNOSIS — B20 Human immunodeficiency virus [HIV] disease: Secondary | ICD-10-CM

## 2014-03-23 DIAGNOSIS — Z79899 Other long term (current) drug therapy: Secondary | ICD-10-CM

## 2014-03-23 DIAGNOSIS — Z113 Encounter for screening for infections with a predominantly sexual mode of transmission: Secondary | ICD-10-CM

## 2014-03-23 LAB — CBC WITH DIFFERENTIAL/PLATELET
Basophils Absolute: 0 10*3/uL (ref 0.0–0.1)
Basophils Relative: 0 % (ref 0–1)
EOS ABS: 0.1 10*3/uL (ref 0.0–0.7)
EOS PCT: 1 % (ref 0–5)
HCT: 41.5 % (ref 39.0–52.0)
Hemoglobin: 14.9 g/dL (ref 13.0–17.0)
Lymphocytes Relative: 26 % (ref 12–46)
Lymphs Abs: 1.3 10*3/uL (ref 0.7–4.0)
MCH: 31.4 pg (ref 26.0–34.0)
MCHC: 35.9 g/dL (ref 30.0–36.0)
MCV: 87.6 fL (ref 78.0–100.0)
MONOS PCT: 6 % (ref 3–12)
Monocytes Absolute: 0.3 10*3/uL (ref 0.1–1.0)
Neutro Abs: 3.4 10*3/uL (ref 1.7–7.7)
Neutrophils Relative %: 67 % (ref 43–77)
PLATELETS: 175 10*3/uL (ref 150–400)
RBC: 4.74 MIL/uL (ref 4.22–5.81)
RDW: 13.6 % (ref 11.5–15.5)
WBC: 5.1 10*3/uL (ref 4.0–10.5)

## 2014-03-24 LAB — COMPLETE METABOLIC PANEL WITH GFR
ALT: 24 U/L (ref 0–53)
AST: 19 U/L (ref 0–37)
Albumin: 4.3 g/dL (ref 3.5–5.2)
Alkaline Phosphatase: 85 U/L (ref 39–117)
BILIRUBIN TOTAL: 0.3 mg/dL (ref 0.2–1.2)
BUN: 15 mg/dL (ref 6–23)
CO2: 24 meq/L (ref 19–32)
CREATININE: 0.87 mg/dL (ref 0.50–1.35)
Calcium: 8.7 mg/dL (ref 8.4–10.5)
Chloride: 104 mEq/L (ref 96–112)
GLUCOSE: 97 mg/dL (ref 70–99)
Potassium: 3.8 mEq/L (ref 3.5–5.3)
Sodium: 137 mEq/L (ref 135–145)
Total Protein: 6.4 g/dL (ref 6.0–8.3)

## 2014-03-24 LAB — T-HELPER CELL (CD4) - (RCID CLINIC ONLY)
CD4 T CELL ABS: 490 /uL (ref 400–2700)
CD4 T CELL HELPER: 40 % (ref 33–55)

## 2014-03-24 LAB — LIPID PANEL
Cholesterol: 214 mg/dL — ABNORMAL HIGH (ref 0–200)
HDL: 41 mg/dL (ref >39)
LDL Cholesterol: 152 mg/dL — ABNORMAL HIGH (ref 0–99)
Total CHOL/HDL Ratio: 5.2 Ratio
Triglycerides: 107 mg/dL (ref <150)
VLDL: 21 mg/dL (ref 0–40)

## 2014-03-24 LAB — HIV-1 RNA QUANT-NO REFLEX-BLD: HIV 1 RNA Quant: <20 copies/mL (ref <20)

## 2014-03-25 LAB — RPR

## 2014-04-05 ENCOUNTER — Other Ambulatory Visit: Payer: Self-pay

## 2014-04-05 ENCOUNTER — Encounter: Payer: Self-pay | Admitting: Infectious Diseases

## 2014-04-05 ENCOUNTER — Ambulatory Visit (INDEPENDENT_AMBULATORY_CARE_PROVIDER_SITE_OTHER): Payer: PRIVATE HEALTH INSURANCE | Admitting: Infectious Diseases

## 2014-04-05 VITALS — BP 150/116 | HR 94 | Temp 98.9°F | Ht 69.0 in | Wt 215.0 lb

## 2014-04-05 DIAGNOSIS — I10 Essential (primary) hypertension: Secondary | ICD-10-CM

## 2014-04-05 DIAGNOSIS — B20 Human immunodeficiency virus [HIV] disease: Secondary | ICD-10-CM

## 2014-04-05 DIAGNOSIS — R079 Chest pain, unspecified: Secondary | ICD-10-CM

## 2014-04-05 MED ORDER — AMLODIPINE BESYLATE 10 MG PO TABS
10.0000 mg | ORAL_TABLET | Freq: Every day | ORAL | Status: DC
Start: 2014-04-05 — End: 2015-04-21

## 2014-04-05 NOTE — Assessment & Plan Note (Signed)
He is doing very well. vax are uptodate. Offered/refused condoms. Will see him back in 4-6 weeks for BP check.

## 2014-04-05 NOTE — Assessment & Plan Note (Signed)
Will start pt on norvasc for his bp. See him back in 4-6 weeks for BP check.

## 2014-04-05 NOTE — Assessment & Plan Note (Signed)
ECG is NSR 93. Will have him seen by CV for GXT. I suspect this may be esophageal spasm? Will start him on anti-htn rx.

## 2014-04-05 NOTE — Progress Notes (Signed)
   Subjective:    Patient ID: Edward Hendricks, male    DOB: Dec 18, 1960, 53 y.o.   MRN: [id number]  HPI 53 yo M with HIV+ and HTN. His BP has elevated at home. Has also noted fluttering feeling/missed beats in his chest, more on L. Transient for 1-2 beats.  No CP, no SOB, no diaphoresis, no radiation. Feels need to cough when this happens.  Has taken allegra for his sinuses as well as using neti-pot.   HIV 1 RNA Quant (copies/mL)  Date Value  03/23/2014 <20   09/07/2013 <20   01/06/2013 <20      CD4 T Cell Abs (/uL)  Date Value  03/23/2014 490   09/07/2013 720   01/06/2013 580      Review of Systems  Constitutional: Negative for appetite change and unexpected weight change.  HENT: Positive for sinus pressure.   Cardiovascular: Positive for palpitations. Negative for chest pain.  Gastrointestinal: Negative for diarrhea and constipation.  Genitourinary: Negative for difficulty urinating.  Neurological: Negative for dizziness and headaches.   Hasn't been exercising as much, makes "my head hurt".     Objective:   Physical Exam  Constitutional: He appears well-developed and well-nourished.  HENT:  Mouth/Throat: No oropharyngeal exudate.  Eyes: EOM are normal. Pupils are equal, round, and reactive to light.  Neck: Neck supple.  Cardiovascular: Normal rate, regular rhythm and normal heart sounds.   Pulmonary/Chest: Effort normal and breath sounds normal.  Abdominal: Soft. Bowel sounds are normal. He exhibits no distension. There is no tenderness.  Lymphadenopathy:    He has no cervical adenopathy.          Assessment & Plan:

## 2014-05-17 ENCOUNTER — Encounter: Payer: Self-pay | Admitting: Infectious Diseases

## 2014-05-17 ENCOUNTER — Ambulatory Visit (INDEPENDENT_AMBULATORY_CARE_PROVIDER_SITE_OTHER): Payer: PRIVATE HEALTH INSURANCE | Admitting: Infectious Diseases

## 2014-05-17 VITALS — BP 148/92 | HR 92 | Temp 98.2°F | Wt 213.0 lb

## 2014-05-17 DIAGNOSIS — I10 Essential (primary) hypertension: Secondary | ICD-10-CM

## 2014-05-17 DIAGNOSIS — B20 Human immunodeficiency virus [HIV] disease: Secondary | ICD-10-CM

## 2014-05-17 NOTE — Assessment & Plan Note (Signed)
Doing much better.  Will get exercise stress.

## 2014-05-17 NOTE — Progress Notes (Signed)
   Subjective:    Patient ID: Edward Hendricks, male    DOB: 07/10/1961, 53 y.o.   MRN: [id number]  HPI 53 yo M with HIV+, HTN. His norvasc has helped, he no longer is having palpitations. Checks BP at home, 137/89.  Otherwise feeling well.  Still having some R ear pressure.   HIV 1 RNA Quant (copies/mL)  Date Value  03/23/2014 <20   09/07/2013 <20   01/06/2013 <20      CD4 T Cell Abs (/uL)  Date Value  03/23/2014 490   09/07/2013 720   01/06/2013 580    Has started a diet.   Review of Systems  Constitutional: Negative for appetite change and unexpected weight change.  HENT: Positive for sinus pressure.   Gastrointestinal: Negative for diarrhea and constipation.  Genitourinary: Negative for difficulty urinating.       Objective:   Physical Exam  Constitutional: He appears well-developed and well-nourished.  HENT:  Right Ear: Tympanic membrane is retracted.  Mouth/Throat: No oropharyngeal exudate.  Eyes: EOM are normal. Pupils are equal, round, and reactive to light.  Neck: Neck supple.  Cardiovascular: Normal rate, regular rhythm and normal heart sounds.   Pulmonary/Chest: Effort normal and breath sounds normal.  Abdominal: Soft. Bowel sounds are normal. He exhibits no distension. There is no tenderness.  Lymphadenopathy:    He has no cervical adenopathy.          Assessment & Plan:

## 2014-05-17 NOTE — Assessment & Plan Note (Signed)
Will try to get into dental. Offered, refused condoms.  He is doing well despite drop in CD4.  Will see him back in 4-6 months.

## 2014-05-27 ENCOUNTER — Ambulatory Visit: Payer: PRIVATE HEALTH INSURANCE | Admitting: Cardiovascular Disease

## 2014-07-08 ENCOUNTER — Other Ambulatory Visit: Payer: Self-pay | Admitting: Infectious Diseases

## 2014-07-09 ENCOUNTER — Other Ambulatory Visit: Payer: Self-pay | Admitting: *Deleted

## 2014-07-09 ENCOUNTER — Telehealth: Payer: Self-pay | Admitting: *Deleted

## 2014-07-09 DIAGNOSIS — E291 Testicular hypofunction: Secondary | ICD-10-CM

## 2014-07-09 MED ORDER — TESTOSTERONE 20.25 MG/ACT (1.62%) TD GEL: TRANSDERMAL | Status: DC

## 2014-07-09 NOTE — Telephone Encounter (Signed)
Refill request from pharmacy for testosterone. [facility] FYI; patient has not been filling consistently. Last refilled 12/2013 and 04/2014. Myrtis Hopping

## 2014-07-09 NOTE — Telephone Encounter (Signed)
Ok to refill 

## 2014-08-02 ENCOUNTER — Other Ambulatory Visit: Payer: Self-pay | Admitting: Infectious Diseases

## 2014-08-02 DIAGNOSIS — B20 Human immunodeficiency virus [HIV] disease: Secondary | ICD-10-CM

## 2014-10-01 ENCOUNTER — Other Ambulatory Visit: Payer: Self-pay

## 2014-11-03 ENCOUNTER — Other Ambulatory Visit: Payer: PRIVATE HEALTH INSURANCE

## 2014-11-03 DIAGNOSIS — B20 Human immunodeficiency virus [HIV] disease: Secondary | ICD-10-CM

## 2014-11-04 LAB — HIV-1 RNA QUANT-NO REFLEX-BLD: HIV 1 RNA Quant: <20 copies/mL (ref <20)

## 2014-11-04 LAB — T-HELPER CELL (CD4) - (RCID CLINIC ONLY)
CD4 % Helper T Cell: 38 % (ref 33–55)
CD4 T Cell Abs: 500 /uL (ref 400–2700)

## 2014-11-17 ENCOUNTER — Ambulatory Visit (INDEPENDENT_AMBULATORY_CARE_PROVIDER_SITE_OTHER): Payer: PRIVATE HEALTH INSURANCE | Admitting: *Deleted

## 2014-11-17 ENCOUNTER — Ambulatory Visit (INDEPENDENT_AMBULATORY_CARE_PROVIDER_SITE_OTHER): Payer: PRIVATE HEALTH INSURANCE | Admitting: Infectious Diseases

## 2014-11-17 ENCOUNTER — Other Ambulatory Visit: Payer: Self-pay | Admitting: *Deleted

## 2014-11-17 ENCOUNTER — Encounter: Payer: Self-pay | Admitting: Infectious Diseases

## 2014-11-17 VITALS — BP 147/84 | HR 78 | Temp 98.4°F | Wt 208.0 lb

## 2014-11-17 DIAGNOSIS — E291 Testicular hypofunction: Secondary | ICD-10-CM

## 2014-11-17 DIAGNOSIS — B20 Human immunodeficiency virus [HIV] disease: Secondary | ICD-10-CM

## 2014-11-17 DIAGNOSIS — Z79899 Other long term (current) drug therapy: Secondary | ICD-10-CM

## 2014-11-17 DIAGNOSIS — E781 Pure hyperglyceridemia: Secondary | ICD-10-CM

## 2014-11-17 DIAGNOSIS — M1990 Unspecified osteoarthritis, unspecified site: Secondary | ICD-10-CM

## 2014-11-17 DIAGNOSIS — M1611 Unilateral primary osteoarthritis, right hip: Secondary | ICD-10-CM | POA: Insufficient documentation

## 2014-11-17 DIAGNOSIS — Z113 Encounter for screening for infections with a predominantly sexual mode of transmission: Secondary | ICD-10-CM

## 2014-11-17 DIAGNOSIS — Z23 Encounter for immunization: Secondary | ICD-10-CM

## 2014-11-17 MED ORDER — TESTOSTERONE 2 MG/24HR TD PT24: 1.0000 | MEDICATED_PATCH | Freq: Every day | TRANSDERMAL | Status: DC

## 2014-11-17 NOTE — Assessment & Plan Note (Signed)
Change his rx as mandated by insurance.

## 2014-11-17 NOTE — Assessment & Plan Note (Signed)
Prev plain film was (-). We discussed MRI, eval for AVN, he wishes to defer for now.

## 2014-11-17 NOTE — Assessment & Plan Note (Signed)
Encourage diet and exercise. 

## 2014-11-17 NOTE — Progress Notes (Signed)
   Subjective:    Patient ID: Edward Hendricks, male    DOB: 1961/03/28, 53 y.o.   MRN: [id number]  HPI 53 yo M with HIV+, HTN.  Checks BP at home,  Has been having R hip pain for 1 year. Has been worsening, especially in AM. Also having some episodes of back pain. Has been taking advil pm to help him rest. Plain films normal 09-2013.  Needs to change his androgel due ot insurance.   HIV 1 RNA QUANT (copies/mL)  Date Value  11/03/2014 <20  03/23/2014 <20  09/07/2013 <20   CD4 T CELL ABS (/uL)  Date Value  11/03/2014 500  03/23/2014 490  09/07/2013 720    Review of Systems  Constitutional: Negative for appetite change and unexpected weight change.  Gastrointestinal: Negative for diarrhea and constipation.  Genitourinary: Negative for difficulty urinating.  Musculoskeletal: Positive for back pain and arthralgias.       Objective:   Physical Exam  Constitutional: He appears well-developed and well-nourished.  HENT:  Mouth/Throat: No oropharyngeal exudate.  Eyes: EOM are normal. Pupils are equal, round, and reactive to light.  Neck: Neck supple.  Cardiovascular: Normal rate, regular rhythm and normal heart sounds.   Pulmonary/Chest: Effort normal and breath sounds normal.  Abdominal: Soft. Bowel sounds are normal. He exhibits no distension. There is no tenderness.  Musculoskeletal:       Legs: Lymphadenopathy:    He has no cervical adenopathy.          Assessment & Plan:

## 2014-11-17 NOTE — Assessment & Plan Note (Signed)
He is doing very well. Will continue his current art. Repeat full labs at f/u visit in 6 months. Check Hep B S Ab. Offered/refused condoms, HIV- partner who gets tested with his PCP.

## 2015-02-10 ENCOUNTER — Other Ambulatory Visit: Payer: Self-pay | Admitting: Infectious Diseases

## 2015-02-14 ENCOUNTER — Other Ambulatory Visit: Payer: Self-pay | Admitting: Infectious Diseases

## 2015-02-14 DIAGNOSIS — B20 Human immunodeficiency virus [HIV] disease: Secondary | ICD-10-CM

## 2015-03-31 ENCOUNTER — Other Ambulatory Visit: Payer: Self-pay | Admitting: Licensed Clinical Social Worker

## 2015-03-31 DIAGNOSIS — E291 Testicular hypofunction: Secondary | ICD-10-CM

## 2015-03-31 MED ORDER — TESTOSTERONE 20.25 MG/ACT (1.62%) TD GEL: TRANSDERMAL | Status: DC

## 2015-04-21 ENCOUNTER — Other Ambulatory Visit: Payer: Self-pay | Admitting: Infectious Diseases

## 2015-05-19 ENCOUNTER — Ambulatory Visit: Payer: PRIVATE HEALTH INSURANCE

## 2015-05-19 DIAGNOSIS — Z113 Encounter for screening for infections with a predominantly sexual mode of transmission: Secondary | ICD-10-CM

## 2015-05-19 DIAGNOSIS — Z79899 Other long term (current) drug therapy: Secondary | ICD-10-CM

## 2015-05-19 DIAGNOSIS — B20 Human immunodeficiency virus [HIV] disease: Secondary | ICD-10-CM

## 2015-05-19 LAB — COMPREHENSIVE METABOLIC PANEL
ALT: 38 U/L (ref 0–53)
AST: 22 U/L (ref 0–37)
Albumin: 4.5 g/dL (ref 3.5–5.2)
Alkaline Phosphatase: 80 U/L (ref 39–117)
BUN: 16 mg/dL (ref 6–23)
CALCIUM: 8.9 mg/dL (ref 8.4–10.5)
CHLORIDE: 105 meq/L (ref 96–112)
CO2: 22 mEq/L (ref 19–32)
Creat: 0.87 mg/dL (ref 0.50–1.35)
Glucose, Bld: 83 mg/dL (ref 70–99)
Potassium: 3.9 mEq/L (ref 3.5–5.3)
Sodium: 139 mEq/L (ref 135–145)
TOTAL PROTEIN: 6.5 g/dL (ref 6.0–8.3)
Total Bilirubin: 0.5 mg/dL (ref 0.2–1.2)

## 2015-05-19 LAB — CBC
HCT: 42.3 % (ref 39.0–52.0)
HEMOGLOBIN: 15 g/dL (ref 13.0–17.0)
MCH: 31.8 pg (ref 26.0–34.0)
MCHC: 35.5 g/dL (ref 30.0–36.0)
MCV: 89.8 fL (ref 78.0–100.0)
MPV: 10.1 fL (ref 8.6–12.4)
PLATELETS: 186 10*3/uL (ref 150–400)
RBC: 4.71 MIL/uL (ref 4.22–5.81)
RDW: 12.7 % (ref 11.5–15.5)
WBC: 6.5 10*3/uL (ref 4.0–10.5)

## 2015-05-19 LAB — LIPID PANEL
Cholesterol: 221 mg/dL — ABNORMAL HIGH (ref 0–200)
HDL: 40 mg/dL (ref >=40)
LDL Cholesterol: 161 mg/dL — ABNORMAL HIGH (ref 0–99)
Total CHOL/HDL Ratio: 5.5 Ratio
Triglycerides: 98 mg/dL (ref <150)
VLDL: 20 mg/dL (ref 0–40)

## 2015-05-20 LAB — RPR

## 2015-05-20 LAB — T-HELPER CELL (CD4) - (RCID CLINIC ONLY)
CD4 % Helper T Cell: 37 % (ref 33–55)
CD4 T CELL ABS: 770 /uL (ref 400–2700)

## 2015-05-20 LAB — HEPATITIS B SURFACE ANTIBODY,QUALITATIVE: Hep B S Ab: POSITIVE — AB

## 2015-05-22 LAB — HIV-1 RNA QUANT-NO REFLEX-BLD

## 2015-05-31 ENCOUNTER — Ambulatory Visit (INDEPENDENT_AMBULATORY_CARE_PROVIDER_SITE_OTHER): Payer: PRIVATE HEALTH INSURANCE | Admitting: Infectious Diseases

## 2015-05-31 ENCOUNTER — Encounter: Payer: Self-pay | Admitting: Infectious Diseases

## 2015-05-31 VITALS — BP 143/97 | HR 79 | Temp 98.7°F | Wt 208.0 lb

## 2015-05-31 DIAGNOSIS — E291 Testicular hypofunction: Secondary | ICD-10-CM

## 2015-05-31 DIAGNOSIS — Z79899 Other long term (current) drug therapy: Secondary | ICD-10-CM

## 2015-05-31 DIAGNOSIS — M1990 Unspecified osteoarthritis, unspecified site: Secondary | ICD-10-CM | POA: Diagnosis not present

## 2015-05-31 DIAGNOSIS — B20 Human immunodeficiency virus [HIV] disease: Secondary | ICD-10-CM

## 2015-05-31 DIAGNOSIS — Z113 Encounter for screening for infections with a predominantly sexual mode of transmission: Secondary | ICD-10-CM

## 2015-05-31 DIAGNOSIS — R1313 Dysphagia, pharyngeal phase: Secondary | ICD-10-CM | POA: Insufficient documentation

## 2015-05-31 NOTE — Progress Notes (Signed)
   Subjective:    Patient ID: Edward Hendricks, male    DOB: 12/13/1961, 54 y.o.   MRN: [id number]  HPI  54 yo M with HIV+, HTN. Checks BP at home, feels like it has been high due to pain.  Has been having R hip prior (did not want MRI at last visit).   HIV 1 RNA QUANT (copies/mL)  Date Value  05/19/2015 <20  11/03/2014 <20  03/23/2014 <20   CD4 T CELL ABS (/uL)  Date Value  05/19/2015 770  11/03/2014 500  03/23/2014 490   Lab Results  Component Value Date   CHOL 221* 05/19/2015   HDL 40 05/19/2015   LDLCALC 161* 05/19/2015   TRIG 98 05/19/2015   CHOLHDL 5.5 05/19/2015     Still having pain in his joints, feet. Having burning pain in his feet, worse in AM. No joint swelling or discoloration. Has heat in joints in feet. Having joint popping in other extr. Having difficulty sleeping as joint pain wakes him up.  Has been having trouble swallowing for years. Feels like there is area of cartilage in his R neck that catches and gives him severe pain.    having difficulty sleeping and is wonder if he is depressed.     Review of Systems  Constitutional: Negative for fever, chills and unexpected weight change.  Gastrointestinal: Negative for diarrhea and constipation.  Genitourinary: Negative for difficulty urinating.  Musculoskeletal: Positive for arthralgias.      Objective:   Physical Exam  Constitutional: He appears well-developed and well-nourished.  HENT:  Mouth/Throat: No oropharyngeal exudate.  Eyes: EOM are normal. Pupils are equal, round, and reactive to light.  Neck: Neck supple.  Cardiovascular: Normal rate, regular rhythm and normal heart sounds.   Pulmonary/Chest: Effort normal and breath sounds normal.  Abdominal: Soft. Bowel sounds are normal. He exhibits no distension.  Lymphadenopathy:    He has no cervical adenopathy.      Assessment & Plan:

## 2015-05-31 NOTE — Assessment & Plan Note (Signed)
He is doing very well.  Will continue his atripla. He is here with his HIV (-) partner. I offered him prep (refuses). Offered/refused condoms.  Will see him back in 6 months.

## 2015-05-31 NOTE — Assessment & Plan Note (Signed)
Will have him seen by rheum. His pains are diffuse. There is nothing in his foot exam to suggest gout.

## 2015-05-31 NOTE — Assessment & Plan Note (Signed)
I am not sure of the cause of this. Needs ENT.

## 2015-05-31 NOTE — Assessment & Plan Note (Signed)
Will continue his testosterone.

## 2015-09-15 ENCOUNTER — Other Ambulatory Visit: Payer: Self-pay | Admitting: Infectious Diseases

## 2015-09-15 DIAGNOSIS — E291 Testicular hypofunction: Secondary | ICD-10-CM

## 2015-09-15 MED ORDER — TESTOSTERONE 20.25 MG/ACT (1.62%) TD GEL: TRANSDERMAL | Status: DC

## 2015-10-02 ENCOUNTER — Other Ambulatory Visit: Payer: Self-pay | Admitting: Infectious Disease

## 2015-11-21 ENCOUNTER — Other Ambulatory Visit: Payer: PRIVATE HEALTH INSURANCE

## 2015-11-21 DIAGNOSIS — Z79899 Other long term (current) drug therapy: Secondary | ICD-10-CM

## 2015-11-21 DIAGNOSIS — B20 Human immunodeficiency virus [HIV] disease: Secondary | ICD-10-CM

## 2015-11-21 DIAGNOSIS — Z113 Encounter for screening for infections with a predominantly sexual mode of transmission: Secondary | ICD-10-CM

## 2015-11-21 LAB — LIPID PANEL
CHOL/HDL RATIO: 5.7 ratio — AB (ref <=5.0)
Cholesterol: 232 mg/dL — ABNORMAL HIGH (ref 125–200)
HDL: 41 mg/dL (ref >=40)
LDL Cholesterol: 165 mg/dL — ABNORMAL HIGH (ref <130)
TRIGLYCERIDES: 129 mg/dL (ref <150)
VLDL: 26 mg/dL (ref <30)

## 2015-11-21 LAB — COMPREHENSIVE METABOLIC PANEL
ALBUMIN: 4.5 g/dL (ref 3.6–5.1)
ALK PHOS: 81 U/L (ref 40–115)
ALT: 40 U/L (ref 9–46)
AST: 23 U/L (ref 10–35)
BUN: 12 mg/dL (ref 7–25)
CALCIUM: 8.7 mg/dL (ref 8.6–10.3)
CO2: 25 mmol/L (ref 20–31)
Chloride: 105 mmol/L (ref 98–110)
Creat: 1.13 mg/dL (ref 0.70–1.33)
GLUCOSE: 83 mg/dL (ref 65–99)
POTASSIUM: 4 mmol/L (ref 3.5–5.3)
Sodium: 139 mmol/L (ref 135–146)
Total Bilirubin: 0.3 mg/dL (ref 0.2–1.2)
Total Protein: 6.6 g/dL (ref 6.1–8.1)

## 2015-11-21 LAB — CBC
HEMATOCRIT: 41.9 % (ref 39.0–52.0)
Hemoglobin: 15 g/dL (ref 13.0–17.0)
MCH: 32 pg (ref 26.0–34.0)
MCHC: 35.8 g/dL (ref 30.0–36.0)
MCV: 89.3 fL (ref 78.0–100.0)
MPV: 9.9 fL (ref 8.6–12.4)
Platelets: 188 10*3/uL (ref 150–400)
RBC: 4.69 MIL/uL (ref 4.22–5.81)
RDW: 13.8 % (ref 11.5–15.5)
WBC: 6.5 10*3/uL (ref 4.0–10.5)

## 2015-11-22 LAB — RPR

## 2015-11-23 LAB — HIV-1 RNA QUANT-NO REFLEX-BLD: HIV 1 RNA Quant: <20 copies/mL (ref <20)

## 2015-11-23 LAB — T-HELPER CELL (CD4) - (RCID CLINIC ONLY)
CD4 T CELL ABS: 960 /uL (ref 400–2700)
CD4 T CELL HELPER: 40 % (ref 33–55)

## 2015-12-05 ENCOUNTER — Encounter: Payer: Self-pay | Admitting: Infectious Diseases

## 2015-12-05 ENCOUNTER — Ambulatory Visit (INDEPENDENT_AMBULATORY_CARE_PROVIDER_SITE_OTHER): Payer: PRIVATE HEALTH INSURANCE | Admitting: Infectious Diseases

## 2015-12-05 VITALS — BP 142/92 | HR 89 | Temp 97.8°F | Ht 69.0 in | Wt 220.0 lb

## 2015-12-05 DIAGNOSIS — B20 Human immunodeficiency virus [HIV] disease: Secondary | ICD-10-CM | POA: Diagnosis not present

## 2015-12-05 DIAGNOSIS — Z113 Encounter for screening for infections with a predominantly sexual mode of transmission: Secondary | ICD-10-CM | POA: Diagnosis not present

## 2015-12-05 DIAGNOSIS — Z79899 Other long term (current) drug therapy: Secondary | ICD-10-CM

## 2015-12-05 DIAGNOSIS — I10 Essential (primary) hypertension: Secondary | ICD-10-CM

## 2015-12-05 DIAGNOSIS — Z23 Encounter for immunization: Secondary | ICD-10-CM

## 2015-12-05 MED ORDER — EMTRICITAB-RILPIVIR-TENOFOV AF 200-25-25 MG PO TABS: 1.0000 | ORAL_TABLET | Freq: Every day | ORAL | Status: DC

## 2015-12-05 NOTE — Progress Notes (Signed)
   Subjective:    Patient ID: Edward Hendricks, male    DOB: 06-Jul-1961, 54 y.o.   MRN: [id number]  HPI 54 yo M with hx of HIV+ and HTN. On norvasc, atripla.  Hep B responded 05-2015.  Constantly busy, working at [employer].  Checks Bp at home (147/82).   HIV 1 RNA QUANT (copies/mL)  Date Value  11/21/2015 <20  05/19/2015 <20  11/03/2014 <20   CD4 T CELL ABS (/uL)  Date Value  11/21/2015 960  05/19/2015 770  11/03/2014 500   +/- exercise. Tiw.  Review of Systems  Constitutional: Negative for appetite change and unexpected weight change.  Respiratory: Negative for shortness of breath.   Cardiovascular: Negative for chest pain.  Gastrointestinal: Negative for diarrhea and constipation.  Genitourinary: Negative for difficulty urinating.  Neurological: Negative for headaches.       Objective:   Physical Exam  Constitutional: He appears well-developed and well-nourished.  HENT:  Mouth/Throat: No oropharyngeal exudate.  Eyes: EOM are normal. Pupils are equal, round, and reactive to light.  Neck: Neck supple.  Cardiovascular: Normal rate, regular rhythm and normal heart sounds.   Pulmonary/Chest: Effort normal and breath sounds normal.  Abdominal: Soft. Bowel sounds are normal. There is no tenderness. There is no rebound.  Musculoskeletal: He exhibits no edema.  Lymphadenopathy:    He has no cervical adenopathy.      Assessment & Plan:

## 2015-12-05 NOTE — Assessment & Plan Note (Signed)
Will change him to odefsy.  Take with food.  Offered/refused condoms.  Gets flu shot today.  rtc in 3-4 months.

## 2015-12-05 NOTE — Assessment & Plan Note (Signed)
Will cont to work on diet and exercise.  Defer rx change at this point.

## 2016-02-09 ENCOUNTER — Other Ambulatory Visit: Payer: Self-pay | Admitting: Infectious Diseases

## 2016-02-09 DIAGNOSIS — I10 Essential (primary) hypertension: Secondary | ICD-10-CM

## 2016-02-20 ENCOUNTER — Ambulatory Visit: Payer: PRIVATE HEALTH INSURANCE

## 2016-02-20 ENCOUNTER — Ambulatory Visit: Payer: PRIVATE HEALTH INSURANCE | Admitting: Infectious Diseases

## 2016-02-20 DIAGNOSIS — Z79899 Other long term (current) drug therapy: Secondary | ICD-10-CM

## 2016-02-20 DIAGNOSIS — B20 Human immunodeficiency virus [HIV] disease: Secondary | ICD-10-CM

## 2016-02-20 DIAGNOSIS — Z113 Encounter for screening for infections with a predominantly sexual mode of transmission: Secondary | ICD-10-CM

## 2016-02-20 LAB — CBC
HEMATOCRIT: 43.1 % (ref 39.0–52.0)
Hemoglobin: 14.9 g/dL (ref 13.0–17.0)
MCH: 31 pg (ref 26.0–34.0)
MCHC: 34.6 g/dL (ref 30.0–36.0)
MCV: 89.6 fL (ref 78.0–100.0)
MPV: 10.1 fL (ref 8.6–12.4)
Platelets: 195 10*3/uL (ref 150–400)
RBC: 4.81 MIL/uL (ref 4.22–5.81)
RDW: 13.1 % (ref 11.5–15.5)
WBC: 6 10*3/uL (ref 4.0–10.5)

## 2016-02-20 LAB — COMPLETE METABOLIC PANEL WITH GFR
ALT: 45 U/L (ref 9–46)
AST: 23 U/L (ref 10–35)
Albumin: 4.2 g/dL (ref 3.6–5.1)
Alkaline Phosphatase: 80 U/L (ref 40–115)
BILIRUBIN TOTAL: 0.4 mg/dL (ref 0.2–1.2)
BUN: 15 mg/dL (ref 7–25)
CALCIUM: 8.9 mg/dL (ref 8.6–10.3)
CHLORIDE: 106 mmol/L (ref 98–110)
CO2: 21 mmol/L (ref 20–31)
CREATININE: 0.83 mg/dL (ref 0.70–1.33)
GFR, Est African American: >89 mL/min (ref >=60)
GFR, Est Non African American: >89 mL/min (ref >=60)
Glucose, Bld: 97 mg/dL (ref 65–99)
Potassium: 4.1 mmol/L (ref 3.5–5.3)
Sodium: 139 mmol/L (ref 135–146)
TOTAL PROTEIN: 6.6 g/dL (ref 6.1–8.1)

## 2016-02-20 LAB — LIPID PANEL
CHOLESTEROL: 214 mg/dL — AB (ref 125–200)
HDL: 36 mg/dL — ABNORMAL LOW (ref >=40)
LDL CALC: 164 mg/dL — AB (ref <130)
Total CHOL/HDL Ratio: 5.9 Ratio — ABNORMAL HIGH (ref <=5.0)
Triglycerides: 70 mg/dL (ref <150)
VLDL: 14 mg/dL (ref <30)

## 2016-02-21 LAB — HIV-1 RNA QUANT-NO REFLEX-BLD

## 2016-02-21 LAB — T-HELPER CELL (CD4) - (RCID CLINIC ONLY)
CD4 T CELL ABS: 610 /uL (ref 400–2700)
CD4 T CELL HELPER: 37 % (ref 33–55)

## 2016-02-21 LAB — RPR

## 2016-03-05 ENCOUNTER — Ambulatory Visit (INDEPENDENT_AMBULATORY_CARE_PROVIDER_SITE_OTHER): Payer: PRIVATE HEALTH INSURANCE | Admitting: Infectious Diseases

## 2016-03-05 ENCOUNTER — Encounter: Payer: Self-pay | Admitting: Infectious Diseases

## 2016-03-05 VITALS — BP 134/89 | HR 77 | Temp 98.2°F | Ht 69.0 in | Wt 229.0 lb

## 2016-03-05 DIAGNOSIS — I10 Essential (primary) hypertension: Secondary | ICD-10-CM | POA: Diagnosis not present

## 2016-03-05 DIAGNOSIS — B20 Human immunodeficiency virus [HIV] disease: Secondary | ICD-10-CM

## 2016-03-05 NOTE — Assessment & Plan Note (Signed)
Doing well Part of his LE but suspect due to his wt gain.  He will work on wt loss.

## 2016-03-05 NOTE — Progress Notes (Signed)
   Subjective:    Patient ID: Edward Hendricks, male    DOB: 05-11-1961, 55 y.o.   MRN: [id number]  HPI 55 yo M with hx of HIV+ and HTN. On norvasc, atripla ---> odefsy.  Hep B responded 05-2015.  Checks Bp at home (normal).   Has noticed swelling of his LE. Is on his feet a lot at work. Has tried compression socks, they were too tight for him.  Wt up 15# from 03-2014.   HIV 1 RNA QUANT (copies/mL)  Date Value  02/20/2016 <20  11/21/2015 <20  05/19/2015 <20   CD4 T CELL ABS (/uL)  Date Value  02/20/2016 610  11/21/2015 960  05/19/2015 770    Review of Systems  Constitutional: Negative for appetite change and unexpected weight change.  Respiratory: Negative for shortness of breath.   Cardiovascular: Positive for leg swelling.  Gastrointestinal: Negative for diarrhea and constipation.  Genitourinary: Negative for difficulty urinating.  Neurological: Negative for headaches.       Objective:   Physical Exam  Constitutional: He appears well-developed and well-nourished.  HENT:  Mouth/Throat: No oropharyngeal exudate.  Eyes: EOM are normal. Pupils are equal, round, and reactive to light.  Neck: Neck supple.  Cardiovascular: Normal rate, regular rhythm and normal heart sounds.   Pulmonary/Chest: Effort normal and breath sounds normal.  Abdominal: Soft. Bowel sounds are normal. There is no tenderness. There is no rebound.  Lymphadenopathy:    He has no cervical adenopathy.       Assessment & Plan:

## 2016-03-05 NOTE — Assessment & Plan Note (Signed)
He is doing well  His Cr is normal on his new art, better even.  Offered/refused condoms.  Will see hm back in 6 months.

## 2016-04-17 ENCOUNTER — Encounter: Payer: Self-pay | Admitting: Internal Medicine

## 2016-05-23 ENCOUNTER — Other Ambulatory Visit: Payer: Self-pay | Admitting: *Deleted

## 2016-05-23 DIAGNOSIS — E291 Testicular hypofunction: Secondary | ICD-10-CM

## 2016-05-23 MED ORDER — TESTOSTERONE 20.25 MG/ACT (1.62%) TD GEL: TRANSDERMAL | Status: DC

## 2016-08-09 ENCOUNTER — Other Ambulatory Visit: Payer: Self-pay | Admitting: Infectious Diseases

## 2016-08-09 DIAGNOSIS — I10 Essential (primary) hypertension: Secondary | ICD-10-CM

## 2016-09-19 ENCOUNTER — Other Ambulatory Visit: Payer: PRIVATE HEALTH INSURANCE

## 2016-09-19 ENCOUNTER — Other Ambulatory Visit (HOSPITAL_COMMUNITY)
Admission: RE | Admit: 2016-09-19 | Discharge: 2016-09-19 | Disposition: A | Discharge status: Home / Self Care | Payer: No Typology Code available for payment source | Source: Ambulatory Visit | Attending: Infectious Diseases | Admitting: Infectious Diseases

## 2016-09-19 DIAGNOSIS — Z113 Encounter for screening for infections with a predominantly sexual mode of transmission: Secondary | ICD-10-CM | POA: Insufficient documentation

## 2016-09-19 DIAGNOSIS — B20 Human immunodeficiency virus [HIV] disease: Secondary | ICD-10-CM

## 2016-09-19 LAB — CBC
HEMATOCRIT: 44.6 % (ref 38.5–50.0)
HEMOGLOBIN: 15.4 g/dL (ref 13.2–17.1)
MCH: 31.5 pg (ref 27.0–33.0)
MCHC: 34.5 g/dL (ref 32.0–36.0)
MCV: 91.2 fL (ref 80.0–100.0)
MPV: 10.1 fL (ref 7.5–12.5)
Platelets: 198 10*3/uL (ref 140–400)
RBC: 4.89 MIL/uL (ref 4.20–5.80)
RDW: 13.6 % (ref 11.0–15.0)
WBC: 7 10*3/uL (ref 3.8–10.8)

## 2016-09-19 LAB — COMPREHENSIVE METABOLIC PANEL
ALBUMIN: 4.6 g/dL (ref 3.6–5.1)
ALK PHOS: 81 U/L (ref 40–115)
ALT: 46 U/L (ref 9–46)
AST: 26 U/L (ref 10–35)
BUN: 19 mg/dL (ref 7–25)
CALCIUM: 9.6 mg/dL (ref 8.6–10.3)
CO2: 23 mmol/L (ref 20–31)
Chloride: 107 mmol/L (ref 98–110)
Creat: 1.19 mg/dL (ref 0.70–1.33)
Glucose, Bld: 83 mg/dL (ref 65–99)
POTASSIUM: 3.8 mmol/L (ref 3.5–5.3)
Sodium: 140 mmol/L (ref 135–146)
TOTAL PROTEIN: 6.9 g/dL (ref 6.1–8.1)
Total Bilirubin: 0.6 mg/dL (ref 0.2–1.2)

## 2016-09-19 NOTE — Addendum Note (Signed)
Addended byMeriel Pica F on: 09/19/2016 03:32 PM   Modules accepted: Orders

## 2016-09-20 LAB — T-HELPER CELL (CD4) - (RCID CLINIC ONLY)
CD4 T CELL HELPER: 38 % (ref 33–55)
CD4 T Cell Abs: 800 /uL (ref 400–2700)

## 2016-09-20 LAB — URINE CYTOLOGY ANCILLARY ONLY
Chlamydia: NEGATIVE
NEISSERIA GONORRHEA: NEGATIVE

## 2016-09-21 LAB — HIV-1 RNA QUANT-NO REFLEX-BLD
HIV 1 RNA Quant: <20 copies/mL (ref <20)
HIV-1 RNA Quant, Log: <1.3 Log copies/mL (ref <1.30)

## 2016-10-03 ENCOUNTER — Ambulatory Visit (INDEPENDENT_AMBULATORY_CARE_PROVIDER_SITE_OTHER): Payer: PRIVATE HEALTH INSURANCE | Admitting: Infectious Diseases

## 2016-10-03 ENCOUNTER — Encounter: Payer: Self-pay | Admitting: Infectious Diseases

## 2016-10-03 VITALS — BP 135/90 | HR 84 | Temp 98.1°F | Ht 69.0 in | Wt 236.0 lb

## 2016-10-03 DIAGNOSIS — Z789 Other specified health status: Secondary | ICD-10-CM | POA: Diagnosis not present

## 2016-10-03 DIAGNOSIS — Z23 Encounter for immunization: Secondary | ICD-10-CM | POA: Diagnosis not present

## 2016-10-03 DIAGNOSIS — R600 Localized edema: Secondary | ICD-10-CM | POA: Diagnosis not present

## 2016-10-03 DIAGNOSIS — B20 Human immunodeficiency virus [HIV] disease: Secondary | ICD-10-CM

## 2016-10-03 DIAGNOSIS — R609 Edema, unspecified: Secondary | ICD-10-CM | POA: Insufficient documentation

## 2016-10-03 DIAGNOSIS — Z113 Encounter for screening for infections with a predominantly sexual mode of transmission: Secondary | ICD-10-CM

## 2016-10-03 DIAGNOSIS — Z79899 Other long term (current) drug therapy: Secondary | ICD-10-CM

## 2016-10-03 NOTE — Assessment & Plan Note (Signed)
He is doing well Had polyps at prev colon, needs repeat colon this year.  Offered/refused condoms.  Partner is negative. Offered PReP study.  Gets flu shot.  rtc in 6 months.

## 2016-10-03 NOTE — Assessment & Plan Note (Signed)
Will get him into PCP for this and for his arthritis.  His partner sees [name], will try to get him in with him.

## 2016-10-03 NOTE — Progress Notes (Signed)
   Subjective:    Patient ID: Edward Hendricks, male    DOB: 1961-05-16, 55 y.o.   MRN: [id number]  HPI 55 yo M with hx of HIV+ and HTN. On norvasc, atripla ---> odefsy.  Hep B responded 05-2015.  Edward Hendricks been having trouble with LE swelling. Having joint pain "all over".   HIV 1 RNA Quant (copies/mL)  Date Value  09/19/2016 <20  02/20/2016 <20  11/21/2015 <20   CD4 T Cell Abs (/uL)  Date Value  09/19/2016 800  02/20/2016 610  11/21/2015 960    Review of Systems  Constitutional: Negative for appetite change and unexpected weight change.  Gastrointestinal: Negative for constipation and diarrhea.  Genitourinary: Negative for difficulty urinating and hematuria.  wt up 10-15# No bubbly-ness or foaminess of urine.  DOE with climbing 3 flights of stairs.  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  Has taken ibuprofen-arthritis/pain relief. 1g once a day.  Has not been able to fit compression socks.     Objective:   Physical Exam  Constitutional: He appears well-developed and well-nourished.  HENT:  Mouth/Throat: No oropharyngeal exudate.  Eyes: EOM are normal. Pupils are equal, round, and reactive to light.  Neck: Neck supple.  Cardiovascular: Normal rate, regular rhythm and normal heart sounds.   Pulmonary/Chest: Effort normal and breath sounds normal.  Abdominal: Soft. Bowel sounds are normal. There is no tenderness. There is no rebound.  Musculoskeletal: He exhibits edema.  Lymphadenopathy:    He has no cervical adenopathy.      Assessment & Plan:

## 2016-11-28 ENCOUNTER — Other Ambulatory Visit: Payer: Self-pay | Admitting: Infectious Diseases

## 2016-11-28 DIAGNOSIS — E291 Testicular hypofunction: Secondary | ICD-10-CM

## 2016-11-29 ENCOUNTER — Other Ambulatory Visit: Payer: Self-pay | Admitting: *Deleted

## 2016-11-29 DIAGNOSIS — E291 Testicular hypofunction: Secondary | ICD-10-CM

## 2016-11-29 MED ORDER — TESTOSTERONE 20.25 MG/ACT (1.62%) TD GEL: TRANSDERMAL | 2 refills | Status: DC

## 2016-12-03 ENCOUNTER — Other Ambulatory Visit: Payer: Self-pay | Admitting: Infectious Diseases

## 2016-12-03 DIAGNOSIS — B20 Human immunodeficiency virus [HIV] disease: Secondary | ICD-10-CM

## 2017-01-14 ENCOUNTER — Other Ambulatory Visit: Payer: Self-pay | Admitting: Infectious Diseases

## 2017-01-14 DIAGNOSIS — I10 Essential (primary) hypertension: Secondary | ICD-10-CM

## 2017-04-22 ENCOUNTER — Other Ambulatory Visit: Payer: Self-pay | Admitting: Infectious Diseases

## 2017-04-22 DIAGNOSIS — I10 Essential (primary) hypertension: Secondary | ICD-10-CM

## 2017-07-12 ENCOUNTER — Other Ambulatory Visit: Payer: Self-pay | Admitting: Infectious Diseases

## 2017-07-12 DIAGNOSIS — E291 Testicular hypofunction: Secondary | ICD-10-CM

## 2017-07-22 ENCOUNTER — Other Ambulatory Visit: Payer: Self-pay | Admitting: Infectious Diseases

## 2017-07-22 DIAGNOSIS — I10 Essential (primary) hypertension: Secondary | ICD-10-CM

## 2017-07-31 ENCOUNTER — Other Ambulatory Visit: Payer: No Typology Code available for payment source

## 2017-07-31 DIAGNOSIS — Z113 Encounter for screening for infections with a predominantly sexual mode of transmission: Secondary | ICD-10-CM

## 2017-07-31 DIAGNOSIS — Z79899 Other long term (current) drug therapy: Secondary | ICD-10-CM

## 2017-07-31 DIAGNOSIS — B20 Human immunodeficiency virus [HIV] disease: Secondary | ICD-10-CM

## 2017-07-31 LAB — CBC
HEMATOCRIT: 44.8 % (ref 38.5–50.0)
Hemoglobin: 15.3 g/dL (ref 13.2–17.1)
MCH: 32 pg (ref 27.0–33.0)
MCHC: 34.2 g/dL (ref 32.0–36.0)
MCV: 93.7 fL (ref 80.0–100.0)
MPV: 10 fL (ref 7.5–12.5)
PLATELETS: 205 10*3/uL (ref 140–400)
RBC: 4.78 MIL/uL (ref 4.20–5.80)
RDW: 13.7 % (ref 11.0–15.0)
WBC: 7.2 10*3/uL (ref 3.8–10.8)

## 2017-08-01 LAB — T-HELPER CELL (CD4) - (RCID CLINIC ONLY)
CD4 % Helper T Cell: 29 % — ABNORMAL LOW (ref 33–55)
CD4 T CELL ABS: 720 /uL (ref 400–2700)

## 2017-08-01 LAB — COMPREHENSIVE METABOLIC PANEL
ALT: 52 U/L — ABNORMAL HIGH (ref 9–46)
AST: 26 U/L (ref 10–35)
Albumin: 4.6 g/dL (ref 3.6–5.1)
Alkaline Phosphatase: 90 U/L (ref 40–115)
BILIRUBIN TOTAL: 0.5 mg/dL (ref 0.2–1.2)
BUN: 15 mg/dL (ref 7–25)
CALCIUM: 9.2 mg/dL (ref 8.6–10.3)
CO2: 19 mmol/L — AB (ref 20–32)
Chloride: 108 mmol/L (ref 98–110)
Creat: 1.01 mg/dL (ref 0.70–1.33)
Glucose, Bld: 89 mg/dL (ref 65–99)
POTASSIUM: 3.8 mmol/L (ref 3.5–5.3)
Sodium: 140 mmol/L (ref 135–146)
Total Protein: 6.6 g/dL (ref 6.1–8.1)

## 2017-08-01 LAB — LIPID PANEL
Cholesterol: 220 mg/dL — ABNORMAL HIGH (ref <200)
HDL: 35 mg/dL — AB (ref >40)
LDL CALC: 149 mg/dL — AB (ref <100)
Total CHOL/HDL Ratio: 6.3 Ratio — ABNORMAL HIGH (ref <5.0)
Triglycerides: 179 mg/dL — ABNORMAL HIGH (ref <150)
VLDL: 36 mg/dL — ABNORMAL HIGH (ref <30)

## 2017-08-01 LAB — RPR

## 2017-08-04 LAB — HIV-1 RNA QUANT-NO REFLEX-BLD
HIV 1 RNA QUANT: NOT DETECTED {copies}/mL
HIV-1 RNA QUANT, LOG: NOT DETECTED {Log_copies}/mL

## 2017-08-14 ENCOUNTER — Ambulatory Visit (INDEPENDENT_AMBULATORY_CARE_PROVIDER_SITE_OTHER): Payer: No Typology Code available for payment source | Admitting: Infectious Diseases

## 2017-08-14 VITALS — BP 139/89 | HR 99 | Temp 98.8°F | Wt 245.0 lb

## 2017-08-14 DIAGNOSIS — Z113 Encounter for screening for infections with a predominantly sexual mode of transmission: Secondary | ICD-10-CM

## 2017-08-14 DIAGNOSIS — E291 Testicular hypofunction: Secondary | ICD-10-CM

## 2017-08-14 DIAGNOSIS — I10 Essential (primary) hypertension: Secondary | ICD-10-CM | POA: Diagnosis not present

## 2017-08-14 DIAGNOSIS — Z79899 Other long term (current) drug therapy: Secondary | ICD-10-CM | POA: Diagnosis not present

## 2017-08-14 DIAGNOSIS — K089 Disorder of teeth and supporting structures, unspecified: Secondary | ICD-10-CM

## 2017-08-14 DIAGNOSIS — K5730 Diverticulosis of large intestine without perforation or abscess without bleeding: Secondary | ICD-10-CM | POA: Diagnosis not present

## 2017-08-14 DIAGNOSIS — B20 Human immunodeficiency virus [HIV] disease: Secondary | ICD-10-CM | POA: Diagnosis not present

## 2017-08-14 MED ORDER — TESTOSTERONE 20.25 MG/ACT (1.62%) TD GEL: TRANSDERMAL | 2 refills | Status: DC

## 2017-08-14 NOTE — Assessment & Plan Note (Signed)
He is going to call his GI md about repeat colonoscopy.

## 2017-08-14 NOTE — Progress Notes (Signed)
   Subjective:    Patient ID: Edward Hendricks, male    DOB: 1961/09/14, 56 y.o.   MRN: [id number]  HPI 56 yo M with hx of HIV+ and HTN. On norvasc, atripla --->odefsy.  Hep B responded 05-2015.  Has noted more LE edema. Has become painful, sleeps with wedge pillow at night.  Has gained 37#. Has not been able to get in with PCP.   HIV 1 RNA Quant (copies/mL)  Date Value  07/31/2017 <20 NOT DETECTED  09/19/2016 <20  02/20/2016 <20   CD4 T Cell Abs (/uL)  Date Value  07/31/2017 720  09/19/2016 800  02/20/2016 610    Review of Systems  Constitutional: Positive for unexpected weight change. Negative for appetite change.  Respiratory: Negative for cough and shortness of breath.   Cardiovascular: Positive for leg swelling. Negative for chest pain.  Gastrointestinal: Negative for diarrhea and nausea.  Genitourinary: Negative for difficulty urinating.  Psychiatric/Behavioral: Positive for agitation.  gets agitated over wt gain, pain in his legs.  Gets DOE with heat, going up stairs. Inconsistent.  Needs to get into dental. Decay and implant came lose.     Objective:   Physical Exam  Constitutional: He appears well-developed and well-nourished.  HENT:  Mouth/Throat: No oropharyngeal exudate.  Eyes: Pupils are equal, round, and reactive to light. EOM are normal.  Neck: Neck supple.  Cardiovascular: Normal rate, regular rhythm and normal heart sounds.   Pulmonary/Chest: Effort normal and breath sounds normal.  Abdominal: Soft. Bowel sounds are normal. There is no tenderness. There is no rebound.  Musculoskeletal: He exhibits edema.  Lymphadenopathy:    He has no cervical adenopathy.      Assessment & Plan:

## 2017-08-14 NOTE — Assessment & Plan Note (Signed)
Doing well, borderline.

## 2017-08-14 NOTE — Assessment & Plan Note (Signed)
He is going to arrange for dental f/u.

## 2017-08-14 NOTE — Assessment & Plan Note (Addendum)
Doing well. Continue his current art.  Married, HIV-.  Offered/refused condoms.  rtc in 6 months.  Defer TTE to PCP f/u.

## 2017-09-11 ENCOUNTER — Other Ambulatory Visit: Payer: Self-pay | Admitting: Infectious Diseases

## 2017-09-11 DIAGNOSIS — E291 Testicular hypofunction: Secondary | ICD-10-CM

## 2017-09-16 ENCOUNTER — Other Ambulatory Visit: Payer: Self-pay | Admitting: *Deleted

## 2017-09-16 DIAGNOSIS — E291 Testicular hypofunction: Secondary | ICD-10-CM

## 2017-09-19 ENCOUNTER — Other Ambulatory Visit: Payer: Self-pay | Admitting: Infectious Diseases

## 2017-09-19 DIAGNOSIS — B20 Human immunodeficiency virus [HIV] disease: Secondary | ICD-10-CM

## 2017-10-21 ENCOUNTER — Other Ambulatory Visit: Payer: Self-pay | Admitting: Infectious Diseases

## 2017-10-21 DIAGNOSIS — I10 Essential (primary) hypertension: Secondary | ICD-10-CM

## 2018-01-16 ENCOUNTER — Other Ambulatory Visit: Payer: Self-pay | Admitting: Infectious Diseases

## 2018-01-16 DIAGNOSIS — E291 Testicular hypofunction: Secondary | ICD-10-CM

## 2018-01-17 ENCOUNTER — Telehealth: Payer: Self-pay

## 2018-01-17 NOTE — Telephone Encounter (Signed)
Called the pharmacy to call in a prescription for one of Dr. Algis Downs patients it is for Testosterone 20.25 Mg/ACT (1.62) Gel I spoke with the pharmacist who was able to take and confirm the order via phone call. [place]

## 2018-03-04 NOTE — Addendum Note (Signed)
Addended by: Reggy Eye on: 03/04/2018 10:53 AM   Modules accepted: Orders

## 2018-03-05 ENCOUNTER — Other Ambulatory Visit (HOSPITAL_COMMUNITY)
Admission: RE | Admit: 2018-03-05 | Discharge: 2018-03-05 | Disposition: A | Discharge status: Home / Self Care | Payer: No Typology Code available for payment source | Source: Ambulatory Visit | Attending: Infectious Diseases | Admitting: Infectious Diseases

## 2018-03-05 ENCOUNTER — Other Ambulatory Visit: Payer: No Typology Code available for payment source

## 2018-03-05 DIAGNOSIS — Z79899 Other long term (current) drug therapy: Secondary | ICD-10-CM

## 2018-03-05 DIAGNOSIS — B20 Human immunodeficiency virus [HIV] disease: Secondary | ICD-10-CM

## 2018-03-05 DIAGNOSIS — Z113 Encounter for screening for infections with a predominantly sexual mode of transmission: Secondary | ICD-10-CM | POA: Diagnosis not present

## 2018-03-06 LAB — COMPREHENSIVE METABOLIC PANEL
AG RATIO: 1.9 (calc) (ref 1.0–2.5)
ALT: 35 U/L (ref 9–46)
AST: 25 U/L (ref 10–35)
Albumin: 4.6 g/dL (ref 3.6–5.1)
Alkaline phosphatase (APISO): 90 U/L (ref 40–115)
BUN: 14 mg/dL (ref 7–25)
CO2: 25 mmol/L (ref 20–32)
Calcium: 9.3 mg/dL (ref 8.6–10.3)
Chloride: 106 mmol/L (ref 98–110)
Creat: 1.02 mg/dL (ref 0.70–1.33)
GLUCOSE: 104 mg/dL — AB (ref 65–99)
Globulin: 2.4 g/dL (calc) (ref 1.9–3.7)
Potassium: 4.3 mmol/L (ref 3.5–5.3)
SODIUM: 139 mmol/L (ref 135–146)
TOTAL PROTEIN: 7 g/dL (ref 6.1–8.1)
Total Bilirubin: 0.5 mg/dL (ref 0.2–1.2)

## 2018-03-06 LAB — CBC
HCT: 46 % (ref 38.5–50.0)
HEMOGLOBIN: 16 g/dL (ref 13.2–17.1)
MCH: 31.1 pg (ref 27.0–33.0)
MCHC: 34.8 g/dL (ref 32.0–36.0)
MCV: 89.5 fL (ref 80.0–100.0)
MPV: 10.4 fL (ref 7.5–12.5)
Platelets: 183 10*3/uL (ref 140–400)
RBC: 5.14 10*6/uL (ref 4.20–5.80)
RDW: 12.7 % (ref 11.0–15.0)
WBC: 5.3 10*3/uL (ref 3.8–10.8)

## 2018-03-06 LAB — LIPID PANEL
CHOL/HDL RATIO: 5.1 (calc) — AB (ref <5.0)
Cholesterol: 211 mg/dL — ABNORMAL HIGH (ref <200)
HDL: 41 mg/dL (ref >40)
LDL CHOLESTEROL (CALC): 142 mg/dL — AB
Non-HDL Cholesterol (Calc): 170 mg/dL (calc) — ABNORMAL HIGH (ref <130)
TRIGLYCERIDES: 153 mg/dL — AB (ref <150)

## 2018-03-06 LAB — URINE CYTOLOGY ANCILLARY ONLY
Chlamydia: NEGATIVE
Neisseria Gonorrhea: NEGATIVE

## 2018-03-06 LAB — RPR: RPR: NONREACTIVE

## 2018-03-07 LAB — T-HELPER CELL (CD4) - (RCID CLINIC ONLY)
CD4 % Helper T Cell: 38 % (ref 33–55)
CD4 T CELL ABS: 690 /uL (ref 400–2700)

## 2018-03-07 LAB — HIV-1 RNA QUANT-NO REFLEX-BLD
HIV 1 RNA Quant: <20 copies/mL
HIV-1 RNA Quant, Log: <1.3 Log copies/mL

## 2018-03-19 ENCOUNTER — Encounter: Payer: Self-pay | Admitting: Infectious Diseases

## 2018-03-19 ENCOUNTER — Ambulatory Visit (INDEPENDENT_AMBULATORY_CARE_PROVIDER_SITE_OTHER): Payer: No Typology Code available for payment source | Admitting: Infectious Diseases

## 2018-03-19 VITALS — BP 161/94 | HR 85 | Temp 98.6°F | Ht 69.0 in | Wt 246.0 lb

## 2018-03-19 DIAGNOSIS — K089 Disorder of teeth and supporting structures, unspecified: Secondary | ICD-10-CM

## 2018-03-19 DIAGNOSIS — Z113 Encounter for screening for infections with a predominantly sexual mode of transmission: Secondary | ICD-10-CM | POA: Diagnosis not present

## 2018-03-19 DIAGNOSIS — Z79899 Other long term (current) drug therapy: Secondary | ICD-10-CM | POA: Diagnosis not present

## 2018-03-19 DIAGNOSIS — B20 Human immunodeficiency virus [HIV] disease: Secondary | ICD-10-CM | POA: Diagnosis not present

## 2018-03-19 DIAGNOSIS — Z86010 Personal history of colonic polyps: Secondary | ICD-10-CM | POA: Diagnosis not present

## 2018-03-19 DIAGNOSIS — Z23 Encounter for immunization: Secondary | ICD-10-CM

## 2018-03-19 DIAGNOSIS — I10 Essential (primary) hypertension: Secondary | ICD-10-CM

## 2018-03-19 NOTE — Assessment & Plan Note (Signed)
He is going to call Dr Carlean Purl for f/u appt.

## 2018-03-19 NOTE — Assessment & Plan Note (Signed)
He is doing well Will continue his current ART which he is taking well He has HIV- partner who does not want prep.  Offered/refused condoms.  rtc in 9 months.

## 2018-03-19 NOTE — Assessment & Plan Note (Signed)
Working on getting new PCP.  I rec Delma Officer at [facility] to him.

## 2018-03-19 NOTE — Progress Notes (Signed)
   Subjective:    Patient ID: Edward Hendricks, male    DOB: November 17, 1961, 57 y.o.   MRN: [id number]  HPI 57yo M with hx of HIV+ and HTN. On norvasc, atripla --->odefsy.  Has been feeling well, normal aches and pains. Still pending repeat colonoscopy.  Taking art with food, denies missed doses.  Takes prn alleve (at least daily).  LE edema better. Not sure what made it better, less Na.    HIV 1 RNA Quant (copies/mL)  Date Value  03/05/2018 <20 NOT DETECTED  07/31/2017 <20 NOT DETECTED  09/19/2016 <20   CD4 T Cell Abs (/uL)  Date Value  03/05/2018 690  07/31/2017 720  09/19/2016 800    Review of Systems  Constitutional: Negative for appetite change, chills, fever and unexpected weight change.  Respiratory: Negative for cough and shortness of breath.   Cardiovascular: Positive for leg swelling. Negative for chest pain.  Gastrointestinal: Negative for constipation and diarrhea.  Genitourinary: Negative for difficulty urinating.  Neurological: Negative for headaches.  Please see HPI. All other systems reviewed and negative.      Objective:   Physical Exam  Constitutional: He is oriented to person, place, and time. He appears well-developed and well-nourished.  HENT:  Mouth/Throat: Dental caries present. No oropharyngeal exudate.  Eyes: Pupils are equal, round, and reactive to light. EOM are normal.  Neck: Neck supple.  Cardiovascular: Normal rate, regular rhythm and normal heart sounds.  Pulmonary/Chest: Effort normal and breath sounds normal.  Abdominal: Soft. Bowel sounds are normal. There is no tenderness. There is no rebound.  Musculoskeletal: He exhibits no edema.  Lymphadenopathy:    He has no cervical adenopathy.  Neurological: He is alert and oriented to person, place, and time.  Psychiatric: He has a normal mood and affect.       Assessment & Plan:

## 2018-03-19 NOTE — Assessment & Plan Note (Signed)
He has f/u with dental, getting multiple extractions.

## 2018-03-24 ENCOUNTER — Other Ambulatory Visit: Payer: Self-pay | Admitting: Infectious Diseases

## 2018-03-24 DIAGNOSIS — I10 Essential (primary) hypertension: Secondary | ICD-10-CM

## 2018-06-20 ENCOUNTER — Other Ambulatory Visit: Payer: Self-pay | Admitting: Infectious Diseases

## 2018-06-20 DIAGNOSIS — B20 Human immunodeficiency virus [HIV] disease: Secondary | ICD-10-CM

## 2018-08-11 DIAGNOSIS — M545 Low back pain, unspecified: Secondary | ICD-10-CM | POA: Insufficient documentation

## 2018-09-22 ENCOUNTER — Telehealth: Payer: Self-pay | Admitting: *Deleted

## 2018-09-22 NOTE — Telephone Encounter (Signed)
Prior authorization/cost override for odefsey submitted online per rxbenefits requirements. Landis Gandy, RN

## 2018-09-23 NOTE — Telephone Encounter (Signed)
Received fax requesting labs. Faxed back with original pa request. Landis Gandy, RN

## 2018-09-24 NOTE — Telephone Encounter (Signed)
Medication approved 09/22/18 - 10-7/20, [facility] notified.  Copay of $200/90 day supply. RN activated [employer] copay assistance card,copay $0. BIN [id number] PCN ACCESS Group [id number] ID [id number] Landis Gandy, RN

## 2018-10-07 ENCOUNTER — Telehealth: Payer: Self-pay | Admitting: Behavioral Health

## 2018-10-07 NOTE — Telephone Encounter (Signed)
Edward Hendricks came in today to drop ff clearance form for Lumbar decompression L5-S1 today.  Copy was placed in Triage and copy placed in Dr. Algis Downs box.  Needs to be faxed to Dr. Tonita Cong [phone] Pricilla Riffle RN

## 2018-10-07 NOTE — Telephone Encounter (Signed)
Surgery Clearance form signed by Dr. Johnnye Sima and faxed to [facility] today 10/07/2018. Pricilla Riffle RN

## 2018-10-18 ENCOUNTER — Other Ambulatory Visit: Payer: Self-pay | Admitting: Infectious Diseases

## 2018-10-18 DIAGNOSIS — I10 Essential (primary) hypertension: Secondary | ICD-10-CM

## 2018-10-29 NOTE — Telephone Encounter (Signed)
RN called patient. He dropped the form off 10/22. Per chart, this was signed/faxed back the same day to [phone]. Original placed at front for scanning, but it has not yet been uploaded into the patient's chart. Left message with Dr Reather Littler scheduler that this completed form, along with notes, was faxed on 10/22.  Asked for the next step. Landis Gandy, RN

## 2018-11-25 ENCOUNTER — Ambulatory Visit: Payer: Self-pay | Admitting: Orthopedic Surgery

## 2018-11-25 NOTE — H&P (Signed)
Edward Hendricks is an 57 y.o. male.   Chief Complaint: back and leg pain HPI: Reason for Visit: low back Context: The patient is 12 1/2 weeks out from when symptoms began. Location (Lower Extremity): lower back pain ; leg pain , , ; restless leg bilaterally Severity: pain level 3/10 Medications: The patient is taking Gabapentin. Notes: The patient is currently in physical therapy.  Edward Hendricks follows up today for recheck of his back and ongoing right leg pain. He is now 12-1/2 weeks out from onset, did not have a specific injury, woke up one day with pain. Last time that he had seen Edward Hendricks was shortly following his epidural at L5-S1. He did report 2 days of relief from the injection but he also had those 2 days where he was totally resting. Initially physical therapy seem to be more helpful, it did seem to help with his sciatic pain but he is having ongoing numbness and weakness that does not seem to have improved further with physical therapy. He was initially scheduled to return to work yesterday but has been unable to due to ongoing pain. 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 He reports he seemed to aggravate his back even further nearly 2 weeks ago when he slipped on his stairs and caught himself but seem to somewhat jarred the back in the process, he did not fall. After that physical therapy seem to actually become more painful for him. He reports that really did not significantly change his radicular symptoms. He is reporting weakness with stairs on the right, no weakness on the left, numbness in the right foot at the ball of the foot and he has also noticed numbness in the left lateral lower leg. He is experiencing pain in the back itself, a constant nagging ache to the right side lower back which then radiates into the buttock, hip, down the thigh. He is also sometimes experiencing groin pain  bilaterally as well. He notes he sometimes stumbles later in the day and also reports restless leg type symptoms at night. He has ongoing spasms in the back and has been taking tramadol at night for pain which does seem to help but he has run out of that. He reports pain in any position for too long whether he is sitting, standing, walking and has to change positions often. He reports overall the leg pain is better than it was at onset but the numbness and weakness have not seemed to improve at all.  He does report a family history of back issues, both grandparents had significant trouble with her back. He is HIV positive and is on antiretroviral therapy for that. He sees Dr. Johnnye Hendricks who manages his medication for that and acts as his primary care provider. He remains stable and has not converted to AIDS. He denies history of DVT. He denies history of MRSA. He has had itching with amoxicillin in the past but no swelling, hives or anaphylactic type reaction.  Past Medical History:  Diagnosis Date  . Arthritis   . HIV (human immunodeficiency virus infection) ([place])   . IBS (irritable bowel syndrome)   . Personal history of colonic adenomas 03/10/2013    Past Surgical History:  Procedure Laterality Date  . WISDOM TOOTH EXTRACTION  2009    Family History  Problem Relation Age of Onset  . Colon cancer Neg Hx   .  Depression Mother        previously hospitalized   Social History:  reports that he has never smoked. He has never used smokeless tobacco. He reports that he does not drink alcohol or use drugs.  Allergies:  Allergies  Allergen Reactions  . Amoxicillin     REACTION: rash   Medications: amLODIPine 10 mg tablet ANDROGEL PUMP 20.25 MG/ACT (1.62%)GEL Odefsey 200 mg-25 mg-25 mg tablet raNITIdine 300 mg tablet testosterone 20.25 mg/1.25 gram (1.62 %) transdermal gel pump traMADol 50 mg tablet  Review of Systems  Constitutional: Negative.   HENT: Negative.   Eyes: Negative.    Respiratory: Negative.   Cardiovascular: Negative.   Gastrointestinal: Negative.   Genitourinary: Negative.   Musculoskeletal: Positive for back pain.  Skin: Negative.   Neurological: Positive for sensory change and focal weakness.  Psychiatric/Behavioral: Negative.     There were no vitals taken for this visit. Physical Exam  Constitutional: He is oriented to person, place, and time. He appears well-developed.  HENT:  Head: Normocephalic.  Eyes: Pupils are equal, round, and reactive to light.  Neck: Normal range of motion.  Cardiovascular: Normal rate.  Respiratory: Effort normal.  GI: Soft.  Musculoskeletal:  Patient is a 57 year old male.  Patient is awake, alert, and oriented 3. Well-nourished and well-developed. Pleasant and in no acute distress. He ambulates with a slight flexion in the lumbar spine. He is uncomfortable and changes positions often between sitting and standing, but seems to favor sitting despite stiffness following that.  On examination of the lumbar spine, tender to palpation through the spinous processes and right sided paraspinous musculature, right buttock. Nontender left buttock and over the greater trochanter of the hips. Decreased flexion and extension lumbar spine. Positive straight leg raise on the right reproducing back, buttock and thigh pain, negative on the left. Trace EHL weakness on the right, trace weakness with hip flexor testing on the right which could be secondary to pain, otherwise no lower extremity weakness noted, 5/5 quads, hamstrings, plantar and dorsiflexion bilaterally. No groin pain with rotation of the hips bilaterally. Patellar and Achilles reflexes 2+. No clonus present, negative Babinski. Sensation intact distally. No calf pain or sign of DVT.  Neurological: He is alert and oriented to person, place, and time.  Skin: Skin is warm and dry.    Prior x-rays reviewed with degeneration of the disc at L5-S1 with associated foraminal  stenosis.  MRI images and report reviewed again today by Edward Hendricks. At L5-S1 there is loss of disc height, disc degeneration. Multifactorial foraminal narrowing bilaterally, with contact of the exiting bilateral L5 nerve roots with mild cranial dorsal displacement on the left due to bulging of the disc osteophyte complex, redundant ligamentum flavum due to loss of disc height and significant epidural lipomatosis. At L4-5 there is a small right-sided central protrusion which minimally displaces the descending right L5 nerve root.  Assessment/Plan Impression: Nearly 5 months out from onset of ongoing low back pain radiating into the buttock and leg primarily on the right in the L5 dermatomal distribution with associated dysesthesias and weakness and ongoing neural tension signs refractory to physical therapy, home exercise program, epidural steroid injection, medications, activity modification and relative rest. MRI significant for disc osteophyte complex/spinal stenosis at L5-S1 bilaterally. Also with underlying disc degeneration likely contributing to his lower back pain.  Plan: We discussed relevant anatomy and etiology of his pain as listed above. We discussed The importance of ongoing activity modifications both short and long-term to alleviate  ongoing symptoms as well as long-term to prevent reinjury or exacerbation. He did get 2 days of relief following epidural steroid injection. Physical therapy has actually elected to hold off due to lack of improvement/increased pain with treatment. Due to the patient's ongoing radicular symptoms especially with neural tension signs, dysesthesias and weakness on exam would recommend proceeding with a microlumbar decompression at L5-S1. Due to the epidural lipomatosis noted centrally would recommend proceeding with a central decompression which would allow [place] to also address the foraminal narrowing at the same level bilaterally. We discussed the surgery itself as well  as risks, complications, alternatives including but not limited to DVT, PE, failure of procedure, need for secondary procedure, anesthesia risk and even death. We discussed postop protocol, need for physical therapy, walking program, time out of work, recovery. We discussed his underlying disc degeneration and that he may very well have ongoing discogenic lower back pain following surgery as this decompression is really to address the radicular nerve symptoms, not his back pain. We will place him in a back brace after surgery. He may ultimately long-term require a fusion as a salvage procedure/last resort if he does have ongoing chronic lower back pain but would not recommend proceeding with that at this juncture. I have given him a clearance letter for Dr. Johnnye Hendricks to take to his office prior to scheduling surgery. Given the patient's HIV status it would be best to have medical clearance prior to proceeding. Once we do get that clearance back we will proceed accordingly with scheduling for decompression at L5-S1. In the interim he can hold off on physical therapy, I have given him a note to remain out of work, I have refilled tramadol to continue to take as needed at night for pain. Patient was seen in conjunction with Edward Hendricks today. All his questions were answered and he desires to proceed. He will call with any questions or concerns prior to surgery and otherwise follow-up 10-14 days postop for suture removal.  Plan microlumbar decompression L5-S1  Cecilie Kicks., PA-C for Edward Hendricks 11/25/2018, 2:34 PM

## 2018-11-25 NOTE — H&P (View-Only) (Signed)
Edward Hendricks is an 57 y.o. male.   Chief Complaint: back and leg pain HPI: Reason for Visit: low back Context: The patient is 12 1/2 weeks out from when symptoms began. Location (Lower Extremity): lower back pain ; leg pain , , ; restless leg bilaterally Severity: pain level 3/10 Medications: The patient is taking Gabapentin. Notes: The patient is currently in physical therapy.  Edward Hendricks follows up today for recheck of his back and ongoing right leg pain. He is now 12-1/2 weeks out from onset, did not have a specific injury, woke up one day with pain. Last time that he had seen Edward Hendricks was shortly following his epidural at L5-S1. He did report 2 days of relief from the injection but he also had those 2 days where he was totally resting. Initially physical therapy seem to be more helpful, it did seem to help with his sciatic pain but he is having ongoing numbness and weakness that does not seem to have improved further with physical therapy. He was initially scheduled to return to work yesterday but has been unable to due to ongoing pain. 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 He reports he seemed to aggravate his back even further nearly 2 weeks ago when he slipped on his stairs and caught himself but seem to somewhat jarred the back in the process, he did not fall. After that physical therapy seem to actually become more painful for him. He reports that really did not significantly change his radicular symptoms. He is reporting weakness with stairs on the right, no weakness on the left, numbness in the right foot at the ball of the foot and he has also noticed numbness in the left lateral lower leg. He is experiencing pain in the back itself, a constant nagging ache to the right side lower back which then radiates into the buttock, hip, down the thigh. He is also sometimes experiencing groin pain  bilaterally as well. He notes he sometimes stumbles later in the day and also reports restless leg type symptoms at night. He has ongoing spasms in the back and has been taking tramadol at night for pain which does seem to help but he has run out of that. He reports pain in any position for too long whether he is sitting, standing, walking and has to change positions often. He reports overall the leg pain is better than it was at onset but the numbness and weakness have not seemed to improve at all.  He does report a family history of back issues, both grandparents had significant trouble with her back. He is HIV positive and is on antiretroviral therapy for that. He sees Edward Hendricks who manages his medication for that and acts as his primary care provider. He remains stable and has not converted to AIDS. He denies history of DVT. He denies history of MRSA. He has had itching with amoxicillin in the past but no swelling, hives or anaphylactic type reaction.  Past Medical History:  Diagnosis Date  . Arthritis   . HIV (human immunodeficiency virus infection) ([place])   . IBS (irritable bowel syndrome)   . Personal history of colonic adenomas 03/10/2013    Past Surgical History:  Procedure Laterality Date  . WISDOM TOOTH EXTRACTION  2009    Family History  Problem Relation Age of Onset  . Colon cancer Neg Hx   .  Depression Mother        previously hospitalized   Social History:  reports that he has never smoked. He has never used smokeless tobacco. He reports that he does not drink alcohol or use drugs.  Allergies:  Allergies  Allergen Reactions  . Amoxicillin     REACTION: rash   Medications: amLODIPine 10 mg tablet ANDROGEL PUMP 20.25 MG/ACT (1.62%)GEL Odefsey 200 mg-25 mg-25 mg tablet raNITIdine 300 mg tablet testosterone 20.25 mg/1.25 gram (1.62 %) transdermal gel pump traMADol 50 mg tablet  Review of Systems  Constitutional: Negative.   HENT: Negative.   Eyes: Negative.    Respiratory: Negative.   Cardiovascular: Negative.   Gastrointestinal: Negative.   Genitourinary: Negative.   Musculoskeletal: Positive for back pain.  Skin: Negative.   Neurological: Positive for sensory change and focal weakness.  Psychiatric/Behavioral: Negative.     There were no vitals taken for this visit. Physical Exam  Constitutional: He is oriented to person, place, and time. He appears well-developed.  HENT:  Head: Normocephalic.  Eyes: Pupils are equal, round, and reactive to light.  Neck: Normal range of motion.  Cardiovascular: Normal rate.  Respiratory: Effort normal.  GI: Soft.  Musculoskeletal:  Patient is a 57 year old male.  Patient is awake, alert, and oriented 3. Well-nourished and well-developed. Pleasant and in no acute distress. He ambulates with a slight flexion in the lumbar spine. He is uncomfortable and changes positions often between sitting and standing, but seems to favor sitting despite stiffness following that.  On examination of the lumbar spine, tender to palpation through the spinous processes and right sided paraspinous musculature, right buttock. Nontender left buttock and over the greater trochanter of the hips. Decreased flexion and extension lumbar spine. Positive straight leg raise on the right reproducing back, buttock and thigh pain, negative on the left. Trace EHL weakness on the right, trace weakness with hip flexor testing on the right which could be secondary to pain, otherwise no lower extremity weakness noted, 5/5 quads, hamstrings, plantar and dorsiflexion bilaterally. No groin pain with rotation of the hips bilaterally. Patellar and Achilles reflexes 2+. No clonus present, negative Babinski. Sensation intact distally. No calf pain or sign of DVT.  Neurological: He is alert and oriented to person, place, and time.  Skin: Skin is warm and dry.    Prior x-rays reviewed with degeneration of the disc at L5-S1 with associated foraminal  stenosis.  MRI images and report reviewed again today by Edward Hendricks. At L5-S1 there is loss of disc height, disc degeneration. Multifactorial foraminal narrowing bilaterally, with contact of the exiting bilateral L5 nerve roots with mild cranial dorsal displacement on the left due to bulging of the disc osteophyte complex, redundant ligamentum flavum due to loss of disc height and significant epidural lipomatosis. At L4-5 there is a small right-sided central protrusion which minimally displaces the descending right L5 nerve root.  Assessment/Plan Impression: Nearly 5 months out from onset of ongoing low back pain radiating into the buttock and leg primarily on the right in the L5 dermatomal distribution with associated dysesthesias and weakness and ongoing neural tension signs refractory to physical therapy, home exercise program, epidural steroid injection, medications, activity modification and relative rest. MRI significant for disc osteophyte complex/spinal stenosis at L5-S1 bilaterally. Also with underlying disc degeneration likely contributing to his lower back pain.  Plan: We discussed relevant anatomy and etiology of his pain as listed above. We discussed The importance of ongoing activity modifications both short and long-term to alleviate  ongoing symptoms as well as long-term to prevent reinjury or exacerbation. He did get 2 days of relief following epidural steroid injection. Physical therapy has actually elected to hold off due to lack of improvement/increased pain with treatment. Due to the patient's ongoing radicular symptoms especially with neural tension signs, dysesthesias and weakness on exam would recommend proceeding with a microlumbar decompression at L5-S1. Due to the epidural lipomatosis noted centrally would recommend proceeding with a central decompression which would allow [place] to also address the foraminal narrowing at the same level bilaterally. We discussed the surgery itself as well  as risks, complications, alternatives including but not limited to DVT, PE, failure of procedure, need for secondary procedure, anesthesia risk and even death. We discussed postop protocol, need for physical therapy, walking program, time out of work, recovery. We discussed his underlying disc degeneration and that he may very well have ongoing discogenic lower back pain following surgery as this decompression is really to address the radicular nerve symptoms, not his back pain. We will place him in a back brace after surgery. He may ultimately long-term require a fusion as a salvage procedure/last resort if he does have ongoing chronic lower back pain but would not recommend proceeding with that at this juncture. I have given him a clearance letter for Edward Hendricks to take to his office prior to scheduling surgery. Given the patient's HIV status it would be best to have medical clearance prior to proceeding. Once we do get that clearance back we will proceed accordingly with scheduling for decompression at L5-S1. In the interim he can hold off on physical therapy, I have given him a note to remain out of work, I have refilled tramadol to continue to take as needed at night for pain. Patient was seen in conjunction with Edward Hendricks today. All his questions were answered and he desires to proceed. He will call with any questions or concerns prior to surgery and otherwise follow-up 10-14 days postop for suture removal.  Plan microlumbar decompression L5-S1  Cecilie Kicks., PA-C for Edward Hendricks 11/25/2018, 2:34 PM

## 2018-12-01 ENCOUNTER — Encounter (HOSPITAL_COMMUNITY): Payer: Self-pay

## 2018-12-01 NOTE — Pre-Procedure Instructions (Signed)
Edward Hendricks  12/01/2018      Your procedure is scheduled on December 04, 2018.  Report to [facility] Admitting at 07:30 A.M.  Call this number if you have problems the morning of surgery:  [phone]   Remember:  Do not eat or drink after midnight.    Take these medicines the morning of surgery with A SIP OF WATER : Amlodipine (Norvasc) Famotidine (Pepcid) Fexofenadine (Allegra) Meclizine (Antivert) Odefsey   7 days prior to surgery STOP taking any Aspirin (unless otherwise instructed by your surgeon), Voltaren, Aleve, Naproxen, Ibuprofen, Motrin, Advil, Goody's, BC's, all herbal medications, fish oil, and all vitamins.    Do not wear jewelry.  Do not wear lotions, powders, or colognes, or deodorant.  Do not shave 48 hours prior to surgery.  Men may shave face and neck.  Do not bring valuables to the hospital.  [facility] is not responsible for any belongings or valuables.  Contacts, dentures or bridgework may not be worn into surgery.  Leave your suitcase in the car.  After surgery it may be brought to your room.  For patients admitted to the hospital, discharge time will be determined by your treatment team.  Patients discharged the day of surgery will not be allowed to drive home.   Special instructions:   [employer]- Preparing For Surgery  Before surgery, you can play an important role. Because skin is not sterile, your skin needs to be as free of germs as possible. You can reduce the number of germs on your skin by washing with CHG (chlorahexidine gluconate) Soap before surgery.  CHG is an antiseptic cleaner which kills germs and bonds with the skin to continue killing germs even after washing.    Oral Hygiene is also important to reduce your risk of infection.  Remember - BRUSH YOUR TEETH THE MORNING OF SURGERY WITH YOUR REGULAR TOOTHPASTE  Please do not use if you have an allergy to CHG or antibacterial soaps. If your skin becomes  reddened/irritated stop using the CHG.  Do not shave (including legs and underarms) for at least 48 hours prior to first CHG shower. It is OK to shave your face.  Please follow these instructions carefully.   1. Shower the NIGHT BEFORE SURGERY and the MORNING OF SURGERY with CHG.   2. If you chose to wash your hair, wash your hair first as usual with your normal shampoo.  3. After you shampoo, rinse your hair and body thoroughly to remove the shampoo.  4. Use CHG as you would any other liquid soap. You can apply CHG directly to the skin and wash gently with a scrungie or a clean washcloth.   5. Apply the CHG Soap to your body ONLY FROM THE NECK DOWN.  Do not use on open wounds or open sores. Avoid contact with your eyes, ears, mouth and genitals (private parts). Wash Face and genitals (private parts)  with your normal soap.  6. Wash thoroughly, paying special attention to the area where your surgery will be performed.  7. Thoroughly rinse your body with warm water from the neck down.  8. DO NOT shower/wash with your normal soap after using and rinsing off the CHG Soap.  9. Pat yourself dry with a CLEAN TOWEL.  10. Wear CLEAN PAJAMAS to bed the night before surgery, wear comfortable clothes the morning of surgery  11. Place CLEAN SHEETS on your bed the night of your first shower and DO NOT  SLEEP WITH PETS.    Day of Surgery:  Do not apply any deodorants/lotions.  Please wear clean clothes to the hospital/surgery center.   Remember to brush your teeth WITH YOUR REGULAR TOOTHPASTE.    Please read over the following fact sheets that you were given.

## 2018-12-02 ENCOUNTER — Other Ambulatory Visit: Payer: Self-pay

## 2018-12-02 ENCOUNTER — Encounter (HOSPITAL_COMMUNITY): Payer: Self-pay

## 2018-12-02 ENCOUNTER — Encounter (HOSPITAL_COMMUNITY)
Admission: RE | Admit: 2018-12-02 | Discharge: 2018-12-02 | Disposition: A | Discharge status: Home / Self Care | Payer: No Typology Code available for payment source | Source: Ambulatory Visit | Attending: Specialist | Admitting: Specialist

## 2018-12-02 ENCOUNTER — Ambulatory Visit (HOSPITAL_COMMUNITY)
Admission: RE | Admit: 2018-12-02 | Discharge: 2018-12-02 | Disposition: A | Discharge status: Home / Self Care | Payer: No Typology Code available for payment source | Source: Ambulatory Visit | Attending: Orthopedic Surgery | Admitting: Orthopedic Surgery

## 2018-12-02 DIAGNOSIS — M48061 Spinal stenosis, lumbar region without neurogenic claudication: Secondary | ICD-10-CM | POA: Insufficient documentation

## 2018-12-02 DIAGNOSIS — M5126 Other intervertebral disc displacement, lumbar region: Secondary | ICD-10-CM | POA: Diagnosis not present

## 2018-12-02 HISTORY — DX: Pneumonia, unspecified organism: J18.9

## 2018-12-02 HISTORY — DX: Essential (primary) hypertension: I10

## 2018-12-02 HISTORY — DX: Gastro-esophageal reflux disease without esophagitis: K21.9

## 2018-12-02 LAB — BASIC METABOLIC PANEL
Anion gap: 13 (ref 5–15)
BUN: 12 mg/dL (ref 6–20)
CO2: 19 mmol/L — ABNORMAL LOW (ref 22–32)
Calcium: 9.3 mg/dL (ref 8.9–10.3)
Chloride: 107 mmol/L (ref 98–111)
Creatinine, Ser: 0.9 mg/dL (ref 0.61–1.24)
GFR calc Af Amer: >60 mL/min (ref >60)
GFR calc non Af Amer: >60 mL/min (ref >60)
Glucose, Bld: 95 mg/dL (ref 70–99)
Potassium: 3.7 mmol/L (ref 3.5–5.1)
Sodium: 139 mmol/L (ref 135–145)

## 2018-12-02 LAB — CBC
HCT: 47.2 % (ref 39.0–52.0)
Hemoglobin: 16.2 g/dL (ref 13.0–17.0)
MCH: 30.4 pg (ref 26.0–34.0)
MCHC: 34.3 g/dL (ref 30.0–36.0)
MCV: 88.6 fL (ref 80.0–100.0)
Platelets: 251 10*3/uL (ref 150–400)
RBC: 5.33 MIL/uL (ref 4.22–5.81)
RDW: 12 % (ref 11.5–15.5)
WBC: 7.6 10*3/uL (ref 4.0–10.5)
nRBC: 0 % (ref 0.0–0.2)

## 2018-12-02 LAB — SURGICAL PCR SCREEN
MRSA, PCR: NEGATIVE
Staphylococcus aureus: NEGATIVE

## 2018-12-02 NOTE — Progress Notes (Signed)
   12/02/18 1319  OBSTRUCTIVE SLEEP APNEA  Have you ever been diagnosed with sleep apnea through a sleep study? No  Do you snore loudly (loud enough to be heard through closed doors)?  1  Do you often feel tired, fatigued, or sleepy during the daytime (such as falling asleep during driving or talking to someone)? 0  Has anyone observed you stop breathing during your sleep? 0  Do you have, or are you being treated for high blood pressure? 1  BMI more than 35 kg/m2? 1  Age > 50 (1-yes) 1  Neck circumference greater than:Male 16 inches or larger, Male 17inches or larger? 1  Male Gender (Yes=1) 1  Obstructive Sleep Apnea Score 6  Score 5 or greater  Results sent to PCP

## 2018-12-03 ENCOUNTER — Other Ambulatory Visit: Payer: No Typology Code available for payment source

## 2018-12-03 DIAGNOSIS — B20 Human immunodeficiency virus [HIV] disease: Secondary | ICD-10-CM

## 2018-12-03 DIAGNOSIS — Z79899 Other long term (current) drug therapy: Secondary | ICD-10-CM

## 2018-12-03 DIAGNOSIS — Z113 Encounter for screening for infections with a predominantly sexual mode of transmission: Secondary | ICD-10-CM

## 2018-12-04 ENCOUNTER — Ambulatory Visit (HOSPITAL_COMMUNITY): Payer: No Typology Code available for payment source

## 2018-12-04 ENCOUNTER — Encounter (HOSPITAL_COMMUNITY): Payer: Self-pay

## 2018-12-04 ENCOUNTER — Ambulatory Visit (HOSPITAL_COMMUNITY): Payer: No Typology Code available for payment source | Admitting: Anesthesiology

## 2018-12-04 ENCOUNTER — Other Ambulatory Visit: Payer: Self-pay

## 2018-12-04 ENCOUNTER — Ambulatory Visit (HOSPITAL_COMMUNITY)
Admission: RE | Disposition: A | Discharge status: Home / Self Care | Payer: Self-pay | Source: Ambulatory Visit | Attending: Specialist

## 2018-12-04 ENCOUNTER — Ambulatory Visit (HOSPITAL_COMMUNITY)
Admission: RE | Admit: 2018-12-04 | Discharge: 2018-12-05 | Disposition: A | Discharge status: Home / Self Care | Payer: No Typology Code available for payment source | Source: Ambulatory Visit | Attending: Specialist | Admitting: Specialist

## 2018-12-04 DIAGNOSIS — M1990 Unspecified osteoarthritis, unspecified site: Secondary | ICD-10-CM | POA: Insufficient documentation

## 2018-12-04 DIAGNOSIS — Z23 Encounter for immunization: Secondary | ICD-10-CM | POA: Diagnosis not present

## 2018-12-04 DIAGNOSIS — M5136 Other intervertebral disc degeneration, lumbar region: Secondary | ICD-10-CM | POA: Insufficient documentation

## 2018-12-04 DIAGNOSIS — K219 Gastro-esophageal reflux disease without esophagitis: Secondary | ICD-10-CM | POA: Diagnosis not present

## 2018-12-04 DIAGNOSIS — E882 Lipomatosis, not elsewhere classified: Secondary | ICD-10-CM | POA: Diagnosis not present

## 2018-12-04 DIAGNOSIS — Z21 Asymptomatic human immunodeficiency virus [HIV] infection status: Secondary | ICD-10-CM | POA: Insufficient documentation

## 2018-12-04 DIAGNOSIS — Z79899 Other long term (current) drug therapy: Secondary | ICD-10-CM | POA: Diagnosis not present

## 2018-12-04 DIAGNOSIS — Z880 Allergy status to penicillin: Secondary | ICD-10-CM | POA: Insufficient documentation

## 2018-12-04 DIAGNOSIS — M5126 Other intervertebral disc displacement, lumbar region: Secondary | ICD-10-CM

## 2018-12-04 DIAGNOSIS — K589 Irritable bowel syndrome without diarrhea: Secondary | ICD-10-CM | POA: Diagnosis not present

## 2018-12-04 DIAGNOSIS — I10 Essential (primary) hypertension: Secondary | ICD-10-CM | POA: Diagnosis not present

## 2018-12-04 DIAGNOSIS — M2578 Osteophyte, vertebrae: Secondary | ICD-10-CM | POA: Diagnosis not present

## 2018-12-04 DIAGNOSIS — M5116 Intervertebral disc disorders with radiculopathy, lumbar region: Secondary | ICD-10-CM | POA: Diagnosis not present

## 2018-12-04 DIAGNOSIS — M48061 Spinal stenosis, lumbar region without neurogenic claudication: Secondary | ICD-10-CM | POA: Diagnosis not present

## 2018-12-04 DIAGNOSIS — Z419 Encounter for procedure for purposes other than remedying health state, unspecified: Secondary | ICD-10-CM

## 2018-12-04 HISTORY — PX: LUMBAR LAMINECTOMY/DECOMPRESSION MICRODISCECTOMY: SHX5026

## 2018-12-04 LAB — T-HELPER CELL (CD4) - (RCID CLINIC ONLY)
CD4 % Helper T Cell: 34 % (ref 33–55)
CD4 T Cell Abs: 600 /uL (ref 400–2700)

## 2018-12-04 SURGERY — LUMBAR LAMINECTOMY/DECOMPRESSION MICRODISCECTOMY 1 LEVEL
Anesthesia: General | Site: Back

## 2018-12-04 MED ORDER — OXYCODONE HCL 5 MG PO TABS: 5.0000 mg | ORAL_TABLET | ORAL | Status: DC | PRN

## 2018-12-04 MED ORDER — KETOROLAC TROMETHAMINE 30 MG/ML IJ SOLN
INTRAMUSCULAR | Status: AC
  Filled 2018-12-04: qty 1

## 2018-12-04 MED ORDER — TRAMADOL HCL 50 MG PO TABS
50.0000 mg | ORAL_TABLET | Freq: Every day | ORAL | Status: DC
  Administered 2018-12-04: 50 mg via ORAL
  Filled 2018-12-04: qty 1

## 2018-12-04 MED ORDER — POLYETHYLENE GLYCOL 3350 17 G PO PACK: 17.0000 g | PACK | Freq: Every day | ORAL | 0 refills | Status: DC

## 2018-12-04 MED ORDER — PHENYLEPHRINE HCL 10 MG/ML IJ SOLN
INTRAMUSCULAR | Status: DC | PRN
  Administered 2018-12-04 (×5): 80 ug via INTRAVENOUS

## 2018-12-04 MED ORDER — HYDROMORPHONE HCL 1 MG/ML IJ SOLN
0.5000 mg | INTRAMUSCULAR | Status: DC | PRN
  Administered 2018-12-05: 0.5 mg via INTRAVENOUS
  Filled 2018-12-04: qty 1

## 2018-12-04 MED ORDER — BUPIVACAINE-EPINEPHRINE 0.5% -1:200000 IJ SOLN
INTRAMUSCULAR | Status: DC | PRN
  Administered 2018-12-04: 15 mL

## 2018-12-04 MED ORDER — FAMOTIDINE 20 MG PO TABS: 40.0000 mg | ORAL_TABLET | Freq: Every day | ORAL | Status: DC

## 2018-12-04 MED ORDER — ALUM & MAG HYDROXIDE-SIMETH 200-200-20 MG/5ML PO SUSP: 30.0000 mL | Freq: Four times a day (QID) | ORAL | Status: DC | PRN

## 2018-12-04 MED ORDER — PROPOFOL 10 MG/ML IV BOLUS
INTRAVENOUS | Status: DC | PRN
  Administered 2018-12-04: 200 mg via INTRAVENOUS

## 2018-12-04 MED ORDER — PROMETHAZINE HCL 25 MG/ML IJ SOLN
6.2500 mg | INTRAMUSCULAR | Status: DC | PRN
  Administered 2018-12-04: 6.25 mg via INTRAVENOUS

## 2018-12-04 MED ORDER — PHENYLEPHRINE 40 MCG/ML (10ML) SYRINGE FOR IV PUSH (FOR BLOOD PRESSURE SUPPORT)
PREFILLED_SYRINGE | INTRAVENOUS | Status: AC
  Filled 2018-12-04: qty 10

## 2018-12-04 MED ORDER — LACTATED RINGERS IV SOLN
INTRAVENOUS | Status: DC
  Administered 2018-12-04 (×2): via INTRAVENOUS

## 2018-12-04 MED ORDER — METHOCARBAMOL 500 MG PO TABS
500.0000 mg | ORAL_TABLET | Freq: Four times a day (QID) | ORAL | Status: DC | PRN
  Administered 2018-12-04 – 2018-12-05 (×3): 500 mg via ORAL
  Filled 2018-12-04 (×2): qty 1

## 2018-12-04 MED ORDER — ONDANSETRON HCL 4 MG/2ML IJ SOLN
INTRAMUSCULAR | Status: AC
  Filled 2018-12-04: qty 4

## 2018-12-04 MED ORDER — FENTANYL CITRATE (PF) 100 MCG/2ML IJ SOLN
INTRAMUSCULAR | Status: DC | PRN
  Administered 2018-12-04 (×2): 100 ug via INTRAVENOUS

## 2018-12-04 MED ORDER — OXYCODONE HCL 5 MG PO TABS
10.0000 mg | ORAL_TABLET | ORAL | Status: DC | PRN
  Administered 2018-12-04 – 2018-12-05 (×3): 10 mg via ORAL
  Filled 2018-12-04 (×2): qty 2

## 2018-12-04 MED ORDER — METHOCARBAMOL 500 MG PO TABS
ORAL_TABLET | ORAL | Status: AC
  Filled 2018-12-04: qty 1

## 2018-12-04 MED ORDER — PROMETHAZINE HCL 25 MG/ML IJ SOLN
INTRAMUSCULAR | Status: AC
  Filled 2018-12-04: qty 1

## 2018-12-04 MED ORDER — MENTHOL 3 MG MT LOZG: 1.0000 | LOZENGE | OROMUCOSAL | Status: DC | PRN

## 2018-12-04 MED ORDER — SUGAMMADEX SODIUM 200 MG/2ML IV SOLN
INTRAVENOUS | Status: DC | PRN
  Administered 2018-12-04: 234.2 mg via INTRAVENOUS

## 2018-12-04 MED ORDER — METHOCARBAMOL 1000 MG/10ML IJ SOLN
500.0000 mg | Freq: Four times a day (QID) | INTRAVENOUS | Status: DC | PRN
  Filled 2018-12-04: qty 5

## 2018-12-04 MED ORDER — RISAQUAD PO CAPS
1.0000 | ORAL_CAPSULE | Freq: Every day | ORAL | Status: DC
  Administered 2018-12-04 – 2018-12-05 (×2): 1 via ORAL
  Filled 2018-12-04 (×2): qty 1

## 2018-12-04 MED ORDER — ACETAMINOPHEN 325 MG PO TABS
650.0000 mg | ORAL_TABLET | ORAL | Status: DC | PRN
  Administered 2018-12-05: 650 mg via ORAL
  Filled 2018-12-04: qty 2

## 2018-12-04 MED ORDER — BISACODYL 5 MG PO TBEC: 5.0000 mg | DELAYED_RELEASE_TABLET | Freq: Every day | ORAL | Status: DC | PRN

## 2018-12-04 MED ORDER — THROMBIN (RECOMBINANT) 20000 UNITS EX SOLR
CUTANEOUS | Status: AC
  Filled 2018-12-04: qty 20000

## 2018-12-04 MED ORDER — AMLODIPINE BESYLATE 10 MG PO TABS
10.0000 mg | ORAL_TABLET | Freq: Every day | ORAL | Status: DC
  Administered 2018-12-05: 10 mg via ORAL
  Filled 2018-12-04: qty 1

## 2018-12-04 MED ORDER — ONDANSETRON HCL 4 MG/2ML IJ SOLN: 4.0000 mg | Freq: Four times a day (QID) | INTRAMUSCULAR | Status: DC | PRN

## 2018-12-04 MED ORDER — PHENOL 1.4 % MT LIQD: 1.0000 | OROMUCOSAL | Status: DC | PRN

## 2018-12-04 MED ORDER — FENTANYL CITRATE (PF) 250 MCG/5ML IJ SOLN
INTRAMUSCULAR | Status: AC
  Filled 2018-12-04: qty 5

## 2018-12-04 MED ORDER — ACETAMINOPHEN 650 MG RE SUPP: 650.0000 mg | RECTAL | Status: DC | PRN

## 2018-12-04 MED ORDER — MIDAZOLAM HCL 2 MG/2ML IJ SOLN
INTRAMUSCULAR | Status: AC
  Filled 2018-12-04: qty 2

## 2018-12-04 MED ORDER — 0.9 % SODIUM CHLORIDE (POUR BTL) OPTIME
TOPICAL | Status: DC | PRN
  Administered 2018-12-04: 1000 mL

## 2018-12-04 MED ORDER — CEFAZOLIN SODIUM-DEXTROSE 2-4 GM/100ML-% IV SOLN
2.0000 g | Freq: Three times a day (TID) | INTRAVENOUS | Status: AC
  Administered 2018-12-04 – 2018-12-05 (×2): 2 g via INTRAVENOUS
  Filled 2018-12-04 (×2): qty 100

## 2018-12-04 MED ORDER — DOCUSATE SODIUM 100 MG PO CAPS: 100.0000 mg | ORAL_CAPSULE | Freq: Two times a day (BID) | ORAL | 2 refills | Status: AC

## 2018-12-04 MED ORDER — BUPIVACAINE-EPINEPHRINE 0.25% -1:200000 IJ SOLN
INTRAMUSCULAR | Status: AC
  Filled 2018-12-04: qty 1

## 2018-12-04 MED ORDER — INFLUENZA VAC SPLIT QUAD 0.5 ML IM SUSY
0.5000 mL | PREFILLED_SYRINGE | INTRAMUSCULAR | Status: AC
  Administered 2018-12-05: 0.5 mL via INTRAMUSCULAR
  Filled 2018-12-04: qty 0.5

## 2018-12-04 MED ORDER — OXYCODONE HCL 5 MG PO TABS: 5.0000 mg | ORAL_TABLET | ORAL | 0 refills | Status: DC | PRN

## 2018-12-04 MED ORDER — MIDAZOLAM HCL 5 MG/5ML IJ SOLN
INTRAMUSCULAR | Status: DC | PRN
  Administered 2018-12-04: 2 mg via INTRAVENOUS

## 2018-12-04 MED ORDER — LIDOCAINE HCL (CARDIAC) PF 100 MG/5ML IV SOSY
PREFILLED_SYRINGE | INTRAVENOUS | Status: DC | PRN
  Administered 2018-12-04: 100 mg via INTRAVENOUS

## 2018-12-04 MED ORDER — POLYETHYLENE GLYCOL 3350 17 G PO PACK: 17.0000 g | PACK | Freq: Every day | ORAL | Status: DC | PRN

## 2018-12-04 MED ORDER — MECLIZINE HCL 12.5 MG PO TABS
12.5000 mg | ORAL_TABLET | Freq: Three times a day (TID) | ORAL | Status: DC
  Administered 2018-12-04 – 2018-12-05 (×2): 12.5 mg via ORAL
  Filled 2018-12-04 (×3): qty 1

## 2018-12-04 MED ORDER — ACETAMINOPHEN 10 MG/ML IV SOLN
1000.0000 mg | INTRAVENOUS | Status: AC
  Administered 2018-12-04: 1000 mg via INTRAVENOUS
  Filled 2018-12-04: qty 100

## 2018-12-04 MED ORDER — ONDANSETRON HCL 4 MG PO TABS: 4.0000 mg | ORAL_TABLET | Freq: Four times a day (QID) | ORAL | Status: DC | PRN

## 2018-12-04 MED ORDER — HYDROMORPHONE HCL 1 MG/ML IJ SOLN: 0.2500 mg | INTRAMUSCULAR | Status: DC | PRN

## 2018-12-04 MED ORDER — ROCURONIUM BROMIDE 50 MG/5ML IV SOSY
PREFILLED_SYRINGE | INTRAVENOUS | Status: AC
  Filled 2018-12-04: qty 10

## 2018-12-04 MED ORDER — OXYCODONE HCL 5 MG PO TABS
ORAL_TABLET | ORAL | Status: AC
  Filled 2018-12-04: qty 2

## 2018-12-04 MED ORDER — PROPOFOL 10 MG/ML IV BOLUS
INTRAVENOUS | Status: AC
  Filled 2018-12-04: qty 20

## 2018-12-04 MED ORDER — ONDANSETRON HCL 4 MG/2ML IJ SOLN
INTRAMUSCULAR | Status: DC | PRN
  Administered 2018-12-04: 4 mg via INTRAVENOUS

## 2018-12-04 MED ORDER — METHOCARBAMOL 500 MG PO TABS: 500.0000 mg | ORAL_TABLET | Freq: Four times a day (QID) | ORAL | 1 refills | Status: DC | PRN

## 2018-12-04 MED ORDER — CEFAZOLIN SODIUM-DEXTROSE 2-4 GM/100ML-% IV SOLN
2.0000 g | INTRAVENOUS | Status: AC
  Administered 2018-12-04: 2 g via INTRAVENOUS
  Filled 2018-12-04: qty 100

## 2018-12-04 MED ORDER — DOCUSATE SODIUM 100 MG PO CAPS
100.0000 mg | ORAL_CAPSULE | Freq: Two times a day (BID) | ORAL | Status: DC
  Administered 2018-12-04 – 2018-12-05 (×2): 100 mg via ORAL
  Filled 2018-12-04 (×2): qty 1

## 2018-12-04 MED ORDER — LORATADINE 10 MG PO TABS
10.0000 mg | ORAL_TABLET | Freq: Every day | ORAL | Status: DC
  Administered 2018-12-04 – 2018-12-05 (×2): 10 mg via ORAL
  Filled 2018-12-04 (×2): qty 1

## 2018-12-04 MED ORDER — MAGNESIUM CITRATE PO SOLN: 1.0000 | Freq: Once | ORAL | Status: DC | PRN

## 2018-12-04 MED ORDER — MEPERIDINE HCL 50 MG/ML IJ SOLN: 6.2500 mg | INTRAMUSCULAR | Status: DC | PRN

## 2018-12-04 MED ORDER — KCL IN DEXTROSE-NACL 20-5-0.45 MEQ/L-%-% IV SOLN
INTRAVENOUS | Status: DC
  Administered 2018-12-04: 18:00:00 via INTRAVENOUS
  Filled 2018-12-04: qty 1000

## 2018-12-04 MED ORDER — KETOROLAC TROMETHAMINE 30 MG/ML IJ SOLN
30.0000 mg | Freq: Once | INTRAMUSCULAR | Status: AC | PRN
  Administered 2018-12-04: 30 mg via INTRAVENOUS

## 2018-12-04 MED ORDER — ROCURONIUM BROMIDE 100 MG/10ML IV SOLN
INTRAVENOUS | Status: DC | PRN
  Administered 2018-12-04: 60 mg via INTRAVENOUS
  Administered 2018-12-04: 40 mg via INTRAVENOUS

## 2018-12-04 MED ORDER — PANTOPRAZOLE SODIUM 40 MG PO TBEC
40.0000 mg | DELAYED_RELEASE_TABLET | Freq: Every day | ORAL | Status: DC
  Administered 2018-12-04 – 2018-12-05 (×3): 40 mg via ORAL
  Filled 2018-12-04 (×3): qty 1

## 2018-12-04 MED ORDER — SODIUM CHLORIDE 0.9 % IV SOLN
INTRAVENOUS | Status: DC | PRN
  Administered 2018-12-04: 500 mL

## 2018-12-04 MED ORDER — SODIUM CHLORIDE 0.9 % IV SOLN
INTRAVENOUS | Status: DC | PRN
  Administered 2018-12-04: 40 ug/min via INTRAVENOUS

## 2018-12-04 MED ORDER — THROMBIN 20000 UNITS EX SOLR
CUTANEOUS | Status: DC | PRN
  Administered 2018-12-04: 11:00:00 via TOPICAL

## 2018-12-04 MED ORDER — LIDOCAINE 2% (20 MG/ML) 5 ML SYRINGE
INTRAMUSCULAR | Status: AC
  Filled 2018-12-04: qty 5

## 2018-12-04 SURGICAL SUPPLY — 59 items
BAG DECANTER FOR FLEXI CONT (MISCELLANEOUS) ×3 IMPLANT
CANISTER SUCTION 1500CC (MISCELLANEOUS) ×2 IMPLANT
CLEANER TIP ELECTROSURG 2X2 (MISCELLANEOUS) ×5 IMPLANT
CLOSURE WOUND 1/2 X4 (GAUZE/BANDAGES/DRESSINGS) ×1
CLOTH 2% CHLOROHEXIDINE 3PK (PERSONAL CARE ITEMS) ×3 IMPLANT
CONT SPEC 4OZ CLIKSEAL STRL BL (MISCELLANEOUS) ×3 IMPLANT
COVER WAND RF STERILE (DRAPES) ×3 IMPLANT
DRAPE LAPAROTOMY 100X72X124 (DRAPES) ×3 IMPLANT
DRAPE MICROSCOPE LEICA (MISCELLANEOUS) ×3 IMPLANT
DRAPE SHEET LG 3/4 BI-LAMINATE (DRAPES) ×3 IMPLANT
DRAPE SURG 17X11 SM STRL (DRAPES) ×3 IMPLANT
DRAPE UTILITY XL STRL (DRAPES) ×3 IMPLANT
DRESSING AQUACEL AG SP 3.5X4 (GAUZE/BANDAGES/DRESSINGS) IMPLANT
DRSG AQUACEL AG ADV 3.5X 4 (GAUZE/BANDAGES/DRESSINGS) IMPLANT
DRSG AQUACEL AG ADV 3.5X 6 (GAUZE/BANDAGES/DRESSINGS) IMPLANT
DRSG AQUACEL AG SP 3.5X4 (GAUZE/BANDAGES/DRESSINGS) ×3
DRSG TELFA 3X8 NADH (GAUZE/BANDAGES/DRESSINGS) IMPLANT
DURAPREP 26ML APPLICATOR (WOUND CARE) ×3 IMPLANT
DURASEAL SPINE SEALANT 3ML (MISCELLANEOUS) IMPLANT
ELECT BLADE 4.0 EZ CLEAN MEGAD (MISCELLANEOUS) ×3
ELECT REM PT RETURN 9FT ADLT (ELECTROSURGICAL) ×3
ELECTRODE BLDE 4.0 EZ CLN MEGD (MISCELLANEOUS) IMPLANT
ELECTRODE REM PT RTRN 9FT ADLT (ELECTROSURGICAL) ×1 IMPLANT
GLOVE BIOGEL PI IND STRL 7.0 (GLOVE) ×1 IMPLANT
GLOVE BIOGEL PI INDICATOR 7.0 (GLOVE) ×2
GLOVE SURG SS PI 6.5 STRL IVOR (GLOVE) ×4 IMPLANT
GLOVE SURG SS PI 7.5 STRL IVOR (GLOVE) ×3 IMPLANT
GLOVE SURG SS PI 8.0 STRL IVOR (GLOVE) ×6 IMPLANT
GOWN STRL REUS W/ TWL LRG LVL3 (GOWN DISPOSABLE) ×1 IMPLANT
GOWN STRL REUS W/ TWL XL LVL3 (GOWN DISPOSABLE) ×1 IMPLANT
GOWN STRL REUS W/TWL LRG LVL3 (GOWN DISPOSABLE) ×3
GOWN STRL REUS W/TWL XL LVL3 (GOWN DISPOSABLE) ×3
IV CATH 14GX2 1/4 (CATHETERS) ×3 IMPLANT
KIT BASIN OR (CUSTOM PROCEDURE TRAY) ×3 IMPLANT
KIT POSITION SURG JACKSON T1 (MISCELLANEOUS) ×3 IMPLANT
NDL SPNL 18GX3.5 QUINCKE PK (NEEDLE) ×2 IMPLANT
NEEDLE 22X1 1/2 (OR ONLY) (NEEDLE) ×5 IMPLANT
NEEDLE SPNL 18GX3.5 QUINCKE PK (NEEDLE) ×6 IMPLANT
PACK LAMINECTOMY NEURO (CUSTOM PROCEDURE TRAY) ×3 IMPLANT
PAD DRESSING TELFA 3X8 NADH (GAUZE/BANDAGES/DRESSINGS) IMPLANT
PATTIES SURGICAL .75X.75 (GAUZE/BANDAGES/DRESSINGS) ×3 IMPLANT
RUBBERBAND STERILE (MISCELLANEOUS) ×2 IMPLANT
SPONGE LAP 4X18 RFD (DISPOSABLE) ×2 IMPLANT
SPONGE SURGIFOAM ABS GEL 100 (HEMOSTASIS) ×3 IMPLANT
STAPLER VISISTAT (STAPLE) IMPLANT
STRIP CLOSURE SKIN 1/2X4 (GAUZE/BANDAGES/DRESSINGS) ×2 IMPLANT
SUT BONE WAX W31G (SUTURE) ×2 IMPLANT
SUT NURALON 4 0 TR CR/8 (SUTURE) IMPLANT
SUT PROLENE 3 0 PS 2 (SUTURE) ×2 IMPLANT
SUT VIC AB 1 CT1 27 (SUTURE) ×3
SUT VIC AB 1 CT1 27XBRD ANTBC (SUTURE) IMPLANT
SUT VIC AB 1-0 CT2 27 (SUTURE) IMPLANT
SUT VIC AB 2-0 CT1 27 (SUTURE) ×3
SUT VIC AB 2-0 CT1 TAPERPNT 27 (SUTURE) IMPLANT
SUT VIC AB 2-0 CT2 27 (SUTURE) IMPLANT
SYR 3ML LL SCALE MARK (SYRINGE) ×3 IMPLANT
TOWEL GREEN STERILE (TOWEL DISPOSABLE) ×3 IMPLANT
TOWEL GREEN STERILE FF (TOWEL DISPOSABLE) ×3 IMPLANT
YANKAUER SUCT BULB TIP NO VENT (SUCTIONS) ×3 IMPLANT

## 2018-12-04 NOTE — Discharge Instructions (Signed)

## 2018-12-04 NOTE — Anesthesia Postprocedure Evaluation (Signed)
Anesthesia Post Note  Patient: Edward Hendricks  Procedure(s) Performed: Kern Reap decompression L5-S1 (N/A Back)     Patient location during evaluation: PACU Anesthesia Type: General Level of consciousness: awake Pain management: pain level controlled Vital Signs Assessment: post-procedure vital signs reviewed and stable Respiratory status: spontaneous breathing Cardiovascular status: stable Postop Assessment: no apparent nausea or vomiting Anesthetic complications: no    Last Vitals:  Vitals:   12/04/18 1310 12/04/18 1315  BP: 118/70   Pulse: 87 91  Resp: (!) 7 (!) 25  Temp:    SpO2: 100% 100%    Last Pain:  Vitals:   12/04/18 1315  TempSrc:   PainSc: 2    Pain Goal: Patients Stated Pain Goal: 3 (12/04/18 0747)               Huston Foley

## 2018-12-04 NOTE — Transfer of Care (Signed)
Immediate Anesthesia Transfer of Care Note  Patient: Edward Hendricks  Procedure(s) Performed: Kern Reap decompression L5-S1 (N/A Back)  Patient Location: PACU  Anesthesia Type:General  Level of Consciousness: drowsy  Airway & Oxygen Therapy: Patient Spontanous Breathing and Patient connected to face mask oxygen  Post-op Assessment: Report given to RN, Post -op Vital signs reviewed and stable and Patient moving all extremities  Post vital signs: Reviewed and stable  Last Vitals:  Vitals Value Taken Time  BP 143/106 12/04/2018 12:44 PM  Temp 36.6 C 12/04/2018 12:44 PM  Pulse 93 12/04/2018 12:47 PM  Resp 17 12/04/2018 12:47 PM  SpO2 99 % 12/04/2018 12:47 PM  Vitals shown include unvalidated device data.  Last Pain:  Vitals:   12/04/18 1244  TempSrc:   PainSc: 0-No pain      Patients Stated Pain Goal: 3 (59/92/34 1443)  Complications: No apparent anesthesia complications

## 2018-12-04 NOTE — Interval H&P Note (Signed)
History and Physical Interval Note:  12/04/2018 9:21 AM  Edward Hendricks  has presented today for surgery, with the diagnosis of HNP/stenosis L5-S1  The various methods of treatment have been discussed with the patient and family. After consideration of risks, benefits and other options for treatment, the patient has consented to  Procedure(s) with comments: Microlumbar decompression L5-S1 (N/A) - 120 mins as a surgical intervention .  The patient's history has been reviewed, patient examined, no change in status, stable for surgery.  I have reviewed the patient's chart and labs.  Questions were answered to the patient's satisfaction.     Johnn Hai

## 2018-12-04 NOTE — Progress Notes (Signed)
Pt transferred to 5N32, receiving RN at bedside, no questions or concerns from pt or receiving RN, call bell in reach, family in waiting room and told of patients arrival.  Rowe Pavy, RN

## 2018-12-04 NOTE — Anesthesia Preprocedure Evaluation (Signed)
Anesthesia Evaluation  Patient identified by MRN, date of birth, ID band Patient awake    Reviewed: Allergy & Precautions, NPO status , Patient's Chart, lab work & pertinent test results  Airway Mallampati: II       Dental no notable dental hx. (+) Teeth Intact   Pulmonary neg pulmonary ROS,    Pulmonary exam normal breath sounds clear to auscultation       Cardiovascular hypertension, Pt. on medications Normal cardiovascular exam Rhythm:Regular Rate:Normal     Neuro/Psych negative psych ROS   GI/Hepatic Neg liver ROS, GERD  Medicated,  Endo/Other  negative endocrine ROS  Renal/GU negative Renal ROS     Musculoskeletal  (+) Arthritis , Osteoarthritis,    Abdominal (+) + obese,   Peds  Hematology  (+) HIV,   Anesthesia Other Findings   Reproductive/Obstetrics                             Anesthesia Physical Anesthesia Plan  ASA: III  Anesthesia Plan: General   Post-op Pain Management:    Induction:   PONV Risk Score and Plan: 3 and Ondansetron, Dexamethasone and Midazolam  Airway Management Planned: Oral ETT  Additional Equipment:   Intra-op Plan:   Post-operative Plan: Extubation in OR  Informed Consent: I have reviewed the patients History and Physical, chart, labs and discussed the procedure including the risks, benefits and alternatives for the proposed anesthesia with the patient or authorized representative who has indicated his/her understanding and acceptance.     Plan Discussed with: CRNA  Anesthesia Plan Comments:         Anesthesia Quick Evaluation

## 2018-12-04 NOTE — Op Note (Signed)
NAME: KEONDRICK, DILKS MEDICAL RECORD [id number] ACCOUNT [id number] DATE OF BIRTH:[birth year] FACILITY: [facility] LOCATION: [facility] PHYSICIAN:Camrin Lapre Windy Kalata, MD  OPERATIVE REPORT  DATE OF PROCEDURE:  12/04/2018  PREOPERATIVE DIAGNOSES: 1.  Spinal stenosis L5-S1. 2.  Disk degeneration L5-S1. 3.  Epidural lipomatosis with spinal stenosis L5-S1.  POSTOPERATIVE DIAGNOSES:   1.  Spinal stenosis L5-S1. 2.  Disk degeneration L5-S1. 3.  Epidural lipomatosis with spinal stenosis L5-S1.  PROCEDURE PERFORMED: 1.  Bilateral microlumbar decompression L5-S1. 2.  Foraminotomies L5-S1 bilaterally. 3.  Excision of epidural lipomatosis. 4.  Lysis of extensive epidural venous plexus.  ANESTHESIA:  General.  ASSISTANT:  Lacie Draft, PA  HISTORY:  A 57 year old male with predominantly right lower extremity radicular pain L5 nerve root distribution.  He had epidural lipomatosis and neural foraminal stenosis at L5 on the right temporarily from an epidural steroid injection and  noncompressive disk protrusion L4-L5.  A combination of epidural lipomatosis creating severe compression of the thecal sac and neural foraminal stenosis.  We discussed a lumbar decompression centrally at L4-L5, removal of epidural lipomatosis and a  foraminotomy.  Evaluation of the disk, risks and benefits discussed including bleeding, infection, damage to neurovascular structures, no change in symptoms, worsening symptoms, DVT, PE, anesthetic complications, etc.  TECHNIQUE:  With the patient in supine position after induction of adequate general anesthesia and 2 g of Kefzol, he was placed prone on the [place] table, hips flexed, legs in stirrups.  The lumbar region was prepped and draped in the usual sterile  fashion.  Two 18-gauge spinal needles were utilized to localize the L5-S1 interspace and confirmed with x-ray.  An incision was made from the spinous process of L5-S1.  Subcutaneous tissue was dissected with  electrocautery to achieve hemostasis.   Marcaine 0.25% with epinephrine was infiltrated in the paraspinous musculature at L5-S1.  The dorsal lumbar fascia divided in line with skin incision.  Paraspinous muscle elevated from lamina L5-S1 bilaterally.  McCulloch retractor was placed.  Operating  microscope was draped and brought in the surgical field after confirmatory radiograph.  Small interlaminar window, removed the interspinous ligament, a portion of the spinous process of L5 and S1.  I then used a micro curette to detach ligamentum flavum  from the cephalad edge of S1 and the caudad edge of L5.  I performed a hemilaminotomy bilaterally, centrally and then laterally to the point to detach the ligamentum flavum, preserving the pars.  After removal of ligamentum flavum from the interspace,  exuberant extensive epidural lipomatosis was noted that was removed utilizing a combination of a Woodson retractor and pickups, protecting the neural elements and thecal sac at all times.  I then turned attention over to mainly his symptomatic side and  performed a foraminotomy of L5 and decompressed the lateral recess to the medial border of the pedicle, protecting L4 and L5.  There was neural foraminal stenosis at L5 due to superior articulating process of S1 and ligamentum flavum hypertrophy.  No  disk herniation, but extensive epidural venous plexus was noted on the right, extending out into the foramen as well.  This was cauterized.  Meticulously used an ____ micro curette to remove some of the ligamentum flavum and bony compression noted into  the foramen, protecting the L5 root with a Woodson retractor at all times.  A limited foraminotomy of S1 was performed.  I also decompressed the lateral recess on the left as well as there was ligamentum flavum hypertrophy noted there as well and also to  decompress the epidural lipomatosis.  Limited foraminotomy of L5-S1 was performed on the left.  Next, copiously  irrigated with antibiotic irrigation.  Confirmatory radiograph obtained in the foramen of L5.  I used bone wax thrombin-soaked Gelfoam and bipolar cautery to achieve strict hemostasis.  No evidence of CSF leakage or active bleeding.   Neuro probe passed freely out the foramen of L5 and S1 and above the pedicle of L5 following the decompression.  I copiously irrigated it.  One patty of thrombin-soaked Gelfoam was placed in the laminotomy defect and removed the [facility] retractor.   Paraspinous muscles were irrigated and actively inspected.  No evidence of active bleeding.  I closed the dorsal lumbar fascia with #1 Vicryl interrupted figure-of-eight sutures.  A small aperture was left in case there was any bleeding.  Subcu with 2-0  and skin with staples.  The wound was dressed sterilely, placed supine on the hospital bed, extubated without difficulty and transported to the recovery room in satisfactory condition.  The patient tolerated the procedure well.  No complications.  Assistant was [employer], [place].  Minimal blood loss 25 mL.  LN/NUANCE  D:12/04/2018 T:12/04/2018 JOB:[id number]

## 2018-12-04 NOTE — Brief Op Note (Signed)
12/04/2018  12:34 PM  PATIENT:  Edward Hendricks  57 y.o. male  PRE-OPERATIVE DIAGNOSIS:  HNP/stenosis L5-S1  POST-OPERATIVE DIAGNOSIS:  HNP/stenosis L5-S1  PROCEDURE:  Procedure(s) with comments: Microlumbar decompression L5-S1 (N/A) - 120 mins  SURGEON:  Surgeon(s) and Role:    Susa Day, MD - Primary  PHYSICIAN ASSISTANT:   ASSISTANTS: [place]   ANESTHESIA:   general  EBL:  min   BLOOD ADMINISTERED:none  DRAINS: none   LOCAL MEDICATIONS USED:  MARCAINE     SPECIMEN:  No Specimen  DISPOSITION OF SPECIMEN:  N/A  COUNTS:  YES  TOURNIQUET:  * No tourniquets in log *  DICTATION: .Other Dictation: Dictation Number [id number]  PLAN OF CARE: Admit for overnight observation  PATIENT DISPOSITION:  PACU - hemodynamically stable.   Delay start of Pharmacological VTE agent (>24hrs) due to surgical blood loss or risk of bleeding: yes

## 2018-12-04 NOTE — Anesthesia Procedure Notes (Signed)
Procedure Name: Intubation Date/Time: 12/04/2018 10:14 AM Performed by: Dencil Cayson T, CRNA Pre-anesthesia Checklist: Patient identified, Emergency Drugs available, Suction available and Patient being monitored Patient Re-evaluated:Patient Re-evaluated prior to induction Oxygen Delivery Method: Circle system utilized Preoxygenation: Pre-oxygenation with 100% oxygen Induction Type: Combination inhalational/ intravenous induction Ventilation: Mask ventilation with difficulty, Two handed mask ventilation required and Oral airway inserted - appropriate to patient size Laryngoscope Size: Miller and 3 Grade View: Grade I Tube type: Oral Tube size: 7.5 mm Number of attempts: 1 Airway Equipment and Method: Patient positioned with wedge pillow and Stylet Placement Confirmation: ETT inserted through vocal cords under direct vision,  positive ETCO2 and breath sounds checked- equal and bilateral Secured at: 23 cm Tube secured with: Tape Dental Injury: Teeth and Oropharynx as per pre-operative assessment

## 2018-12-05 ENCOUNTER — Encounter (HOSPITAL_COMMUNITY): Payer: Self-pay | Admitting: Specialist

## 2018-12-05 DIAGNOSIS — M48061 Spinal stenosis, lumbar region without neurogenic claudication: Secondary | ICD-10-CM | POA: Diagnosis not present

## 2018-12-05 LAB — COMPREHENSIVE METABOLIC PANEL
AG Ratio: 2 (calc) (ref 1.0–2.5)
ALBUMIN MSPROF: 4.4 g/dL (ref 3.6–5.1)
ALT: 52 U/L — ABNORMAL HIGH (ref 9–46)
AST: 29 U/L (ref 10–35)
Alkaline phosphatase (APISO): 89 U/L (ref 40–115)
BUN: 14 mg/dL (ref 7–25)
CO2: 23 mmol/L (ref 20–32)
Calcium: 9.1 mg/dL (ref 8.6–10.3)
Chloride: 105 mmol/L (ref 98–110)
Creat: 1.15 mg/dL (ref 0.70–1.33)
Globulin: 2.2 g/dL (calc) (ref 1.9–3.7)
Glucose, Bld: 127 mg/dL — ABNORMAL HIGH (ref 65–99)
Potassium: 3.8 mmol/L (ref 3.5–5.3)
Sodium: 139 mmol/L (ref 135–146)
Total Bilirubin: 0.5 mg/dL (ref 0.2–1.2)
Total Protein: 6.6 g/dL (ref 6.1–8.1)

## 2018-12-05 LAB — HIV-1 RNA QUANT-NO REFLEX-BLD
HIV 1 RNA Quant: 40 copies/mL — ABNORMAL HIGH
HIV-1 RNA Quant, Log: 1.6 Log copies/mL — ABNORMAL HIGH

## 2018-12-05 LAB — CBC
HCT: 44.4 % (ref 38.5–50.0)
Hemoglobin: 15.8 g/dL (ref 13.2–17.1)
MCH: 32.2 pg (ref 27.0–33.0)
MCHC: 35.6 g/dL (ref 32.0–36.0)
MCV: 90.6 fL (ref 80.0–100.0)
MPV: 10.5 fL (ref 7.5–12.5)
Platelets: 206 10*3/uL (ref 140–400)
RBC: 4.9 10*6/uL (ref 4.20–5.80)
RDW: 12.7 % (ref 11.0–15.0)
WBC: 5.2 10*3/uL (ref 3.8–10.8)

## 2018-12-05 LAB — LIPID PANEL
Cholesterol: 224 mg/dL — ABNORMAL HIGH (ref <200)
HDL: 36 mg/dL — ABNORMAL LOW (ref >40)
LDL Cholesterol (Calc): 160 mg/dL (calc) — ABNORMAL HIGH
Non-HDL Cholesterol (Calc): 188 mg/dL (calc) — ABNORMAL HIGH (ref <130)
Total CHOL/HDL Ratio: 6.2 (calc) — ABNORMAL HIGH (ref <5.0)
Triglycerides: 153 mg/dL — ABNORMAL HIGH (ref <150)

## 2018-12-05 LAB — RPR: RPR: NONREACTIVE

## 2018-12-05 MED FILL — Thrombin (Recombinant) For Soln 20000 Unit: CUTANEOUS | Qty: 1 | Status: AC

## 2018-12-05 NOTE — Discharge Summary (Signed)
Physician Discharge Summary   Patient ID: Edward Hendricks MRN: [id number] DOB/AGE: December 29, 1960 57 y.o.  Admit date: 12/04/2018 Discharge date: 12/05/2018  Primary Diagnosis:   HNP/stenosis L5-S1  Admission Diagnoses:  Past Medical History:  Diagnosis Date  . Arthritis    "hands" (12/04/2018)  . GERD (gastroesophageal reflux disease)   . HIV (human immunodeficiency virus infection) ([place]) dx'd 2002  . Hypertension   . IBS (irritable bowel syndrome)   . Personal history of colonic adenomas 03/10/2013  . Pneumonia 1990s    from HIV   Discharge Diagnoses:   Principal Problem:   Spinal stenosis of lumbar region Active Problems:   HNP (herniated nucleus pulposus), lumbar  Procedure:  Procedure(s) (LRB): Microlumbar decompression L5-S1 (N/A)   Consults: None  HPI:  see H&P    Laboratory Data: Appointment on 12/03/2018  Component Date Value Ref Range Status  . Cholesterol 12/03/2018 224* <200 mg/dL Final  . HDL 12/03/2018 36* >40 mg/dL Final  . Triglycerides 12/03/2018 153* <150 mg/dL Final  . LDL Cholesterol (Calc) 12/03/2018 160* mg/dL (calc) Final   Comment: Reference range: <100 . Desirable range <100 mg/dL for primary prevention;   <70 mg/dL for patients with CHD or diabetic patients  with > or = 2 CHD risk factors. Marland Kitchen LDL-C is now calculated using the Martin-Hopkins  calculation, which is a validated novel method providing  better accuracy than the Friedewald equation in the  estimation of LDL-C.  Cresenciano Genre et al. Annamaria Helling. 8315;176(16): 2061-2068  (http://education.[employer].com/faq/FAQ164)   . Total CHOL/HDL Ratio 12/03/2018 6.2* <5.0 (calc) Final  . Non-HDL Cholesterol (Calc) 12/03/2018 188* <130 mg/dL (calc) Final   Comment: For patients with diabetes plus 1 major ASCVD risk  factor, treating to a non-HDL-C goal of <100 mg/dL  (LDL-C of <70 mg/dL) is considered a therapeutic  option.   . WBC 12/03/2018 5.2  3.8 - 10.8 Thousand/uL Final  . RBC  12/03/2018 4.90  4.20 - 5.80 Million/uL Final  . Hemoglobin 12/03/2018 15.8  13.2 - 17.1 g/dL Final  . HCT 12/03/2018 44.4  38.5 - 50.0 % Final  . MCV 12/03/2018 90.6  80.0 - 100.0 fL Final  . MCH 12/03/2018 32.2  27.0 - 33.0 pg Final  . MCHC 12/03/2018 35.6  32.0 - 36.0 g/dL Final  . RDW 12/03/2018 12.7  11.0 - 15.0 % Final  . Platelets 12/03/2018 206  140 - 400 Thousand/uL Final  . MPV 12/03/2018 10.5  7.5 - 12.5 fL Final  . RPR Ser Ql 12/03/2018 NON-REACTIVE  NON-REACTI Final  . Glucose, Bld 12/03/2018 127* 65 - 99 mg/dL Final   Comment: .            Fasting reference interval . For someone without known diabetes, a glucose value >125 mg/dL indicates that they may have diabetes and this should be confirmed with a follow-up test. .   . BUN 12/03/2018 14  7 - 25 mg/dL Final  . Creat 12/03/2018 1.15  0.70 - 1.33 mg/dL Final   Comment: For patients >10 years of age, the reference limit for Creatinine is approximately 13% higher for people identified as African-American. .   Havery Moros Ratio 07/37/1062 NOT APPLICABLE  6 - 22 (calc) Final  . Sodium 12/03/2018 139  135 - 146 mmol/L Final  . Potassium 12/03/2018 3.8  3.5 - 5.3 mmol/L Final  . Chloride 12/03/2018 105  98 - 110 mmol/L Final  . CO2 12/03/2018 23  20 - 32 mmol/L Final  . Calcium 12/03/2018  9.1  8.6 - 10.3 mg/dL Final  . Total Protein 12/03/2018 6.6  6.1 - 8.1 g/dL Final  . Albumin 12/03/2018 4.4  3.6 - 5.1 g/dL Final  . Globulin 12/03/2018 2.2  1.9 - 3.7 g/dL (calc) Final  . AG Ratio 12/03/2018 2.0  1.0 - 2.5 (calc) Final  . Total Bilirubin 12/03/2018 0.5  0.2 - 1.2 mg/dL Final  . Alkaline phosphatase (APISO) 12/03/2018 89  40 - 115 U/L Final  . AST 12/03/2018 29  10 - 35 U/L Final  . ALT 12/03/2018 52* 9 - 46 U/L Final  . CD4 T Cell Abs 12/03/2018 600  400 - 2,700 /uL Final  . CD4 % Helper T Cell 12/03/2018 34  33 - 55 % Final   Performed at [facility], [address] [place],  [place] [address]   Recent Labs    12/02/18 1338 12/03/18 0851  HGB 16.2 15.8   Recent Labs    12/02/18 1338 12/03/18 0851  WBC 7.6 5.2  RBC 5.33 4.90  HCT 47.2 44.4  PLT 251 206   Recent Labs    12/02/18 1338 12/03/18 0851  NA 139 139  K 3.7 3.8  CL 107 105  CO2 19* 23  BUN 12 14  CREATININE 0.90 1.15  GLUCOSE 95 127*  CALCIUM 9.3 9.1   No results for input(s): LABPT, INR in the last 72 hours.  X-Rays:Dg Lumbar Spine 2-3 Views  Result Date: 12/04/2018 CLINICAL DATA:  Localization images in the operating room EXAM: LUMBAR SPINE - 2-3 VIEW COMPARISON:  Lumbar spine films of 12/02/2018 FINDINGS: On the initial cross-table lateral view, needles are positioned posteriorly directed toward the spinous processes of L4 and L5. On the second film the instrument is directed toward the L5-S1 interspace. On the third film and instrument is directed toward the L5-S1 interspace. IMPRESSION: Localization of L5-S1 interspace on the last cross-table lateral view. Electronically Signed   By: Ivar Drape M.D.   On: 12/04/2018 13:33   Dg Lumbar Spine 2-3 Views  Result Date: 12/02/2018 CLINICAL DATA:  57 year old male for lumbar fusion. Right-sided back pain. Initial encounter. EXAM: LUMBAR SPINE - 2-3 VIEW COMPARISON:  None. FINDINGS: Last fully open disc space labeled L5-S1. Correlation with outside level assignment recommended prior to surgery. Moderate L5-S1 and mild to moderate L4-5 disc space narrowing. Radiopaque structures scattered throughout the abdomen and pelvis of indeterminate etiology. IMPRESSION: 1. Last fully open disc space labeled L5-S1. Correlation with outside level assignment recommended prior to surgery. 2. Moderate L5-S1 and mild to moderate L4-5 disc space narrowing. Electronically Signed   By: Genia Del M.D.   On: 12/02/2018 18:47    EKG: Orders placed or performed during the hospital encounter of 12/04/18  . EKG 12-Lead  . EKG 12-Lead  . EKG 12-Lead  . EKG 12-Lead       Hospital Course: Patient was admitted to [facility] and taken to the OR and underwent the above state procedure without complications.  Patient tolerated the procedure well and was later transferred to the recovery room and then to the orthopaedic floor for postoperative care.  They were given PO and IV analgesics for pain control following their surgery.  They were given 24 hours of postoperative antibiotics.   PT was consulted postop to assist with mobility and transfers.  The patient was allowed to be WBAT with therapy and was taught back precautions. Discharge planning was consulted to help with postop disposition and equipment  needs.  Patient had a good night on the evening of surgery and started to get up OOB with therapy on day one. Patient was seen in rounds and was ready to go home on day one.  They were given discharge instructions and dressing directions.  They were instructed on when to follow up in the office with Dr. Tonita Cong.   Diet: Regular diet Activity:WBAT Follow-up:in 10-14 days Disposition - Home Discharged Condition: good   Discharge Instructions    Call MD / Call 911   Complete by:  As directed    If you experience chest pain or shortness of breath, CALL 911 and be transported to the hospital emergency room.  If you develope a fever above 101 F, pus (white drainage) or increased drainage or redness at the wound, or calf pain, call your surgeon's office.   Constipation Prevention   Complete by:  As directed    Drink plenty of fluids.  Prune juice may be helpful.  You may use a stool softener, such as Colace (over the counter) 100 mg twice a day.  Use MiraLax (over the counter) for constipation as needed.   Diet - low sodium heart healthy   Complete by:  As directed    Increase activity slowly as tolerated   Complete by:  As directed      Allergies as of 12/05/2018      Reactions   Amoxicillin Rash      Medication List    TAKE these medications    amLODipine 10 MG tablet Commonly known as:  NORVASC TAKE 1 TABLET BY MOUTH EVERY DAY   diclofenac sodium 1 % Gel Commonly known as:  VOLTAREN Apply 1 application topically at bedtime.   docusate sodium 100 MG capsule Commonly known as:  COLACE Take 1 capsule (100 mg total) by mouth 2 (two) times daily.   famotidine 40 MG tablet Commonly known as:  PEPCID Take 40 mg by mouth at bedtime.   fexofenadine 180 MG tablet Commonly known as:  ALLEGRA Take 180 mg by mouth daily.   meclizine 12.5 MG tablet Commonly known as:  ANTIVERT Take 12.5 mg by mouth 3 (three) times daily.   methocarbamol 500 MG tablet Commonly known as:  ROBAXIN Take 1 tablet (500 mg total) by mouth every 6 (six) hours as needed for muscle spasms.   ODEFSEY 200-25-25 MG Tabs tablet Generic drug:  emtricitabine-rilpivir-tenofovir AF TAKE 1 TABLET BY MOUTH ONCE DAILY WITH BREAKFAST What changed:  See the new instructions.   oxyCODONE 5 MG immediate release tablet Commonly known as:  ROXICODONE Take 1-2 tablets (5-10 mg total) by mouth every 4 (four) hours as needed for severe pain.   pantoprazole 40 MG tablet Commonly known as:  PROTONIX Take 40 mg by mouth daily.   polyethylene glycol packet Commonly known as:  MIRALAX Take 17 g by mouth daily.   Testosterone 20.25 MG/ACT (1.62%) Gel APPLY 4 PUMPS TO THE SKIN EVERY DAY AS DIRECTED   traMADol 50 MG tablet Commonly known as:  ULTRAM Take 50 mg by mouth at bedtime.      Follow-up Information    Susa Day, MD Follow up in 2 week(s).   Specialty:  Orthopedic Surgery Contact information: [address] [place] [address] [phone]        Susa Day, MD In 2 weeks.   Specialty:  Orthopedic Surgery Contact information: [address] [place] [address] [phone]  Signed: Lacie Draft, PA-C Orthopaedic Surgery 12/05/2018, 8:49 AM

## 2018-12-05 NOTE — Progress Notes (Signed)
All education complete with pt.  No PT needs at this time.  Full note to follow.   [place] Pager:  [phone]  Office:  [phone]

## 2018-12-05 NOTE — Progress Notes (Signed)
Subjective: 1 Day Post-Op Procedure(s) (LRB): Microlumbar decompression L5-S1 (N/A) Patient reports pain as moderate.   Reports soreness in the lower back especially when transitioning from sitting to standing. No leg pain, numbness, tingling. Voiding without difficulty. Feels ready to go home today.  Objective: Vital signs in last 24 hours: Temp:  [97.8 F (36.6 C)-99 F (37.2 C)] 99 F (37.2 C) (12/19 2046) Pulse Rate:  [84-95] 92 (12/19 2046) Resp:  [7-26] 18 (12/19 2046) BP: (105-143)/(56-106) 113/83 (12/19 2046) SpO2:  [90 %-100 %] 95 % (12/19 2046)  Intake/Output from previous day: 12/19 0701 - 12/20 0700 In: 1700 [P.O.:300; I.V.:1300; IV Piggyback:100] Out: 100 [Blood:100] Intake/Output this shift: Total I/O In: 220 [P.O.:220] Out: -   Recent Labs    12/02/18 1338 12/03/18 0851  HGB 16.2 15.8   Recent Labs    12/02/18 1338 12/03/18 0851  WBC 7.6 5.2  RBC 5.33 4.90  HCT 47.2 44.4  PLT 251 206   Recent Labs    12/02/18 1338 12/03/18 0851  NA 139 139  K 3.7 3.8  CL 107 105  CO2 19* 23  BUN 12 14  CREATININE 0.90 1.15  GLUCOSE 95 127*  CALCIUM 9.3 9.1   No results for input(s): LABPT, INR in the last 72 hours.  Neurologically intact ABD soft Neurovascular intact Sensation intact distally Intact pulses distally Dorsiflexion/Plantar flexion intact Incision: dressing C/D/I and no drainage No cellulitis present Compartment soft no calf pain or sign of DVT  Assessment/Plan: 1 Day Post-Op Procedure(s) (LRB): Microlumbar decompression L5-S1 (N/A) Advance diet Up with therapy D/C IV fluids Discussed D/C instructions, dressing instructions, Lspine precautions D/C home after AM PT  Follow up as outpt in 2 weeks Discussed with Dr. Mliss Fritz, [place] Rolls. 12/05/2018, 8:47 AM

## 2018-12-05 NOTE — Progress Notes (Signed)
OT Cancellation Note  Patient Details Name: Edward Hendricks MRN: [id number] DOB: 1961-09-14   Cancelled Treatment:    Reason Eval/Treat Not Completed: After consulting PT and Pt, OT screened, no needs identified, will sign off  Jaci Carrel 12/05/2018, 11:20 AM  Hulda Humphrey OTR/L [place] Pager: [phone] Office: [phone]

## 2018-12-05 NOTE — Progress Notes (Signed)
Pt discharged to home in stable condition.  All discharge instructions and Rx's x4 reviewed with and given to pt and partner.  Pt transported off unit via wheelchair with transporters x 1 at chairside.

## 2018-12-05 NOTE — Evaluation (Signed)
Physical Therapy Evaluation and D/C Patient Details Name: Edward Hendricks MRN: [id number] DOB: [birth year] Today's Date: 12/05/2018   History of Present Illness  Pt admit for L5-S1 decompression.    Clinical Impression  Pt admitted with above diagnosis. Pt currently without significant functional limitations. Pt was able to ambulate well and go up and down flights of steps with ease.  Educated on all precautions for home.   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  All pt needs met and no further therapy indicated at this time.  Sign off.   Follow Up Recommendations No PT follow up;Supervision/Assistance - 24 hour    Equipment Recommendations  None recommended by PT(gave pt gait belt)    Recommendations for Other Services       Precautions / Restrictions Precautions Precautions: Back Precaution Booklet Issued: Yes (comment) Restrictions Weight Bearing Restrictions: No      Mobility  Bed Mobility Overal bed mobility: Independent             General bed mobility comments: Pt followed log roll.  Husband present for session.   Transfers Overall transfer level: Needs assistance   Transfers: Sit to/from Stand Sit to Stand: Independent         General transfer comment: cues to place hands on knees and use LES to stand  Ambulation/Gait Ambulation/Gait assistance: Supervision Gait Distance (Feet): 250 Feet Assistive device: None Gait Pattern/deviations: Step-through pattern;Decreased stride length   Gait velocity interpretation: 1.31 - 2.62 ft/sec, indicative of limited community ambulator General Gait Details: Pt able to ambulate without device with good safety.  Slow guarded gait.   Stairs Stairs: Yes Stairs assistance: Modified independent (Device/Increase  time) Stair Management: Alternating pattern;One rail Right;Forwards Number of Stairs: 26 General stair comments: Pt was able to ascemd and descend stairs without difficutly with rail.    Wheelchair Mobility    Modified Rankin (Stroke Patients Only)       Balance                                             Pertinent Vitals/Pain Pain Assessment: 0-10 Pain Score: 4  Pain Location: back Pain Descriptors / Indicators: Aching;Grimacing;Guarding Pain Intervention(s): Limited activity within patient's tolerance;Monitored during session;Premedicated before session;Repositioned    Home Living Family/patient expects to be discharged to:: Private residence Living Arrangements: Spouse/significant other Available Help at Discharge: Available 24 hours/day;Family Type of Home: Apartment Home Access: Stairs to enter Entrance Stairs-Rails: Right Entrance Stairs-Number of Steps: 3 flights Home Layout: One level Home Equipment: Adaptive equipment      Prior Function Level of Independence: Independent               Hand Dominance        Extremity/Trunk Assessment   Upper Extremity Assessment Upper Extremity Assessment: Defer to OT evaluation    Lower Extremity Assessment Lower Extremity Assessment: Overall WFL for tasks assessed    Cervical / Trunk Assessment Cervical / Trunk Assessment: Normal  Communication   Communication: No difficulties  Cognition Arousal/Alertness: Awake/alert Behavior During Therapy: WFL for tasks assessed/performed Overall Cognitive Status: Within Functional Limits for tasks assessed  General Comments General comments (skin integrity, edema, etc.): Husband to get reacher. Pt reported that he has vertigo at times.  PT did quick assessment and pt with right BPPV and treated with canalith repositioning maneuver.  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  Also gave Adelene Amas exercise for future if symptoms return.  Also told pt that he may want to get Outpt PT for vestibular rehab once back heals if symptoms persist and pt and hsuband understand.      Exercises Other Exercises Other Exercises: Access Code: [email or web address]  Other Exercises: Sent to [email or web address]   Assessment/Plan    PT Assessment Patent does not need any further PT services  PT Problem List         PT Treatment Interventions      PT Goals (Current goals can be found in the Care Plan section)  Acute Rehab PT Goals Patient Stated Goal: to go home PT Goal Formulation: All assessment and education complete, DC therapy    Frequency     Barriers to discharge        Co-evaluation               AM-PAC PT "6 Clicks" Mobility  Outcome Measure Help needed turning from your back to your side while in a flat bed without using bedrails?: None Help needed moving from lying on your back to sitting on the side of a flat bed without using bedrails?: None Help needed moving to and from a bed to a chair (including a wheelchair)?: None Help needed standing up from a chair using your arms (e.g., wheelchair or bedside chair)?: None Help needed to walk in hospital room?: None Help needed climbing 3-5 steps with a railing? : A Little 6 Click Score: 23    End of Session Equipment Utilized During Treatment: Gait belt Activity Tolerance: Patient tolerated treatment well Patient left: in chair;with call bell/phone within reach;with family/visitor present Nurse Communication: Mobility status PT Visit Diagnosis: Muscle weakness (generalized) (M62.81);Pain Pain - part of body: (back)    Time: 6962-9528 PT Time Calculation (min) (ACUTE ONLY): 53 min   Charges:   PT Evaluation $PT Eval Moderate Complexity: 1 Mod PT Treatments $Gait Training: 8-22 mins $Therapeutic Exercise: 8-22 mins $Canalith Rep Proc:  8-22 mins        Miia Blanks,PT Acute Rehabilitation Services Pager:  [phone]  Office:  [place] 12/05/2018, 1:26 PM

## 2018-12-24 ENCOUNTER — Ambulatory Visit: Payer: No Typology Code available for payment source | Admitting: Infectious Diseases

## 2018-12-25 ENCOUNTER — Ambulatory Visit: Payer: No Typology Code available for payment source | Admitting: Infectious Diseases

## 2018-12-26 ENCOUNTER — Encounter: Payer: Self-pay | Admitting: Infectious Diseases

## 2018-12-26 ENCOUNTER — Ambulatory Visit (INDEPENDENT_AMBULATORY_CARE_PROVIDER_SITE_OTHER): Payer: No Typology Code available for payment source | Admitting: Infectious Diseases

## 2018-12-26 VITALS — BP 158/92 | HR 96 | Wt 262.0 lb

## 2018-12-26 DIAGNOSIS — Z23 Encounter for immunization: Secondary | ICD-10-CM

## 2018-12-26 DIAGNOSIS — I10 Essential (primary) hypertension: Secondary | ICD-10-CM | POA: Diagnosis not present

## 2018-12-26 DIAGNOSIS — Z79899 Other long term (current) drug therapy: Secondary | ICD-10-CM

## 2018-12-26 DIAGNOSIS — Z113 Encounter for screening for infections with a predominantly sexual mode of transmission: Secondary | ICD-10-CM

## 2018-12-26 DIAGNOSIS — K5730 Diverticulosis of large intestine without perforation or abscess without bleeding: Secondary | ICD-10-CM

## 2018-12-26 DIAGNOSIS — M5126 Other intervertebral disc displacement, lumbar region: Secondary | ICD-10-CM

## 2018-12-26 DIAGNOSIS — K089 Disorder of teeth and supporting structures, unspecified: Secondary | ICD-10-CM | POA: Diagnosis not present

## 2018-12-26 DIAGNOSIS — B20 Human immunodeficiency virus [HIV] disease: Secondary | ICD-10-CM | POA: Diagnosis not present

## 2018-12-26 DIAGNOSIS — E669 Obesity, unspecified: Secondary | ICD-10-CM

## 2018-12-26 NOTE — Progress Notes (Signed)
   Subjective:    Patient ID: Edward Hendricks, male    DOB: 06/05/1961, 58 y.o.   MRN: [id number]  HPI  58yo M with hx of HIV+ and HTN. On norvasc, atripla --->odefsy.  Had L5-S1 micro-decompression on 12-04-18. Has been feeling well since excluding some minor aches.  Has been having difficulty with inner ear- taking rx for this and getting PT as well. Feels disoriented.  Spacing odefsey 12h from his GERD rx. Is to have repeat EGD. Is to have repeat colon as well in the next 6 months.   HIV 1 RNA Quant (copies/mL)  Date Value  12/03/2018 40 (H)  03/05/2018 <20 NOT DETECTED  07/31/2017 <20 NOT DETECTED   CD4 T Cell Abs (/uL)  Date Value  12/03/2018 600  03/05/2018 690  07/31/2017 720     Review of Systems  Constitutional: Negative for appetite change and unexpected weight change.  Gastrointestinal: Positive for constipation. Negative for diarrhea.  Genitourinary: Negative for difficulty urinating.  Musculoskeletal: Negative for back pain.  Neurological: Positive for dizziness and tremors.  urge in AM, but not incontenence. Last po is 9pm.  Please see HPI. All other systems reviewed and negative. Has been out of work since July.     Objective:   Physical Exam Constitutional:      Appearance: He is obese.  HENT:     Mouth/Throat:     Mouth: Mucous membranes are moist.     Pharynx: No oropharyngeal exudate.  Eyes:     Extraocular Movements: Extraocular movements intact.     Pupils: Pupils are equal, round, and reactive to light.  Neck:     Musculoskeletal: Normal range of motion and neck supple.  Cardiovascular:     Rate and Rhythm: Normal rate and regular rhythm.  Pulmonary:     Effort: Pulmonary effort is normal.     Breath sounds: Normal breath sounds.  Abdominal:     General: Bowel sounds are normal.     Palpations: Abdomen is soft.     Tenderness: There is no abdominal tenderness. There is no guarding.  Musculoskeletal:     Right lower leg: No edema.   Left lower leg: No edema.  Neurological:     Mental Status: He is alert.  Psychiatric:        Mood and Affect: Mood normal.           Assessment & Plan:

## 2018-12-26 NOTE — Assessment & Plan Note (Signed)
Encourage wt loss and exercise as tolerated. If continues to be up, will add ACE-I.

## 2018-12-26 NOTE — Assessment & Plan Note (Signed)
Doing well post surgery

## 2018-12-26 NOTE — Assessment & Plan Note (Signed)
Repeat colon this year.

## 2018-12-26 NOTE — Assessment & Plan Note (Signed)
Edentulated, dentures now.

## 2018-12-26 NOTE — Assessment & Plan Note (Signed)
He is doing very well May need to change with continued blips and H2/PPI.  Has gotten flu vax.  PCV today.  Shingles vaccine at pharmacy.  rtc in 9 months.

## 2018-12-26 NOTE — Assessment & Plan Note (Signed)
Encouraged him to diet and exercise as he is getting uncomfortable.  Hopefully will improve with surgery healing.

## 2019-01-01 NOTE — Addendum Note (Signed)
Addended by: Reggy Eye on: 01/01/2019 03:54 PM   Modules accepted: Orders

## 2019-03-16 ENCOUNTER — Other Ambulatory Visit: Payer: Self-pay | Admitting: Infectious Diseases

## 2019-03-16 DIAGNOSIS — B20 Human immunodeficiency virus [HIV] disease: Secondary | ICD-10-CM

## 2019-07-10 ENCOUNTER — Other Ambulatory Visit: Payer: Self-pay | Admitting: Infectious Diseases

## 2019-07-10 DIAGNOSIS — I10 Essential (primary) hypertension: Secondary | ICD-10-CM

## 2019-09-28 ENCOUNTER — Other Ambulatory Visit (HOSPITAL_COMMUNITY)
Admission: RE | Admit: 2019-09-28 | Discharge: 2019-09-28 | Disposition: A | Discharge status: Home / Self Care | Payer: No Typology Code available for payment source | Source: Ambulatory Visit | Attending: Infectious Diseases | Admitting: Infectious Diseases

## 2019-09-28 ENCOUNTER — Other Ambulatory Visit: Payer: Self-pay

## 2019-09-28 ENCOUNTER — Other Ambulatory Visit: Payer: No Typology Code available for payment source

## 2019-09-28 DIAGNOSIS — Z113 Encounter for screening for infections with a predominantly sexual mode of transmission: Secondary | ICD-10-CM

## 2019-09-28 DIAGNOSIS — Z79899 Other long term (current) drug therapy: Secondary | ICD-10-CM

## 2019-09-28 DIAGNOSIS — B20 Human immunodeficiency virus [HIV] disease: Secondary | ICD-10-CM

## 2019-09-28 NOTE — Addendum Note (Signed)
Addended byMeriel Pica F on: 09/28/2019 10:00 AM   Modules accepted: Orders

## 2019-09-29 LAB — T-HELPER CELL (CD4) - (RCID CLINIC ONLY)
CD4 % Helper T Cell: 34 % (ref 33–65)
CD4 T Cell Abs: 720 /uL (ref 400–1790)

## 2019-10-01 LAB — CBC
HCT: 45.5 % (ref 38.5–50.0)
Hemoglobin: 15.8 g/dL (ref 13.2–17.1)
MCH: 31.5 pg (ref 27.0–33.0)
MCHC: 34.7 g/dL (ref 32.0–36.0)
MCV: 90.6 fL (ref 80.0–100.0)
MPV: 10.8 fL (ref 7.5–12.5)
Platelets: 217 10*3/uL (ref 140–400)
RBC: 5.02 10*6/uL (ref 4.20–5.80)
RDW: 12.8 % (ref 11.0–15.0)
WBC: 6.4 10*3/uL (ref 3.8–10.8)

## 2019-10-01 LAB — LIPID PANEL
Cholesterol: 219 mg/dL — ABNORMAL HIGH (ref <200)
HDL: 39 mg/dL — ABNORMAL LOW (ref >=40)
LDL Cholesterol (Calc): 150 mg/dL (calc) — ABNORMAL HIGH
Non-HDL Cholesterol (Calc): 180 mg/dL (calc) — ABNORMAL HIGH (ref <130)
Total CHOL/HDL Ratio: 5.6 (calc) — ABNORMAL HIGH (ref <5.0)
Triglycerides: 164 mg/dL — ABNORMAL HIGH (ref <150)

## 2019-10-01 LAB — COMPREHENSIVE METABOLIC PANEL
AG Ratio: 2 (calc) (ref 1.0–2.5)
ALT: 57 U/L — ABNORMAL HIGH (ref 9–46)
AST: 33 U/L (ref 10–35)
Albumin: 4.5 g/dL (ref 3.6–5.1)
Alkaline phosphatase (APISO): 88 U/L (ref 35–144)
BUN: 11 mg/dL (ref 7–25)
CO2: 23 mmol/L (ref 20–32)
Calcium: 9.4 mg/dL (ref 8.6–10.3)
Chloride: 104 mmol/L (ref 98–110)
Creat: 1.1 mg/dL (ref 0.70–1.33)
Globulin: 2.3 g/dL (calc) (ref 1.9–3.7)
Glucose, Bld: 88 mg/dL (ref 65–99)
Potassium: 4 mmol/L (ref 3.5–5.3)
Sodium: 139 mmol/L (ref 135–146)
Total Bilirubin: 0.5 mg/dL (ref 0.2–1.2)
Total Protein: 6.8 g/dL (ref 6.1–8.1)

## 2019-10-01 LAB — HIV-1 RNA QUANT-NO REFLEX-BLD
HIV 1 RNA Quant: <20 copies/mL
HIV-1 RNA Quant, Log: <1.3 Log copies/mL

## 2019-10-01 LAB — RPR: RPR Ser Ql: NONREACTIVE

## 2019-10-05 LAB — URINE CYTOLOGY ANCILLARY ONLY
Chlamydia: NEGATIVE
Comment: NEGATIVE
Comment: NORMAL
Neisseria Gonorrhea: NEGATIVE

## 2019-10-12 ENCOUNTER — Other Ambulatory Visit: Payer: Self-pay

## 2019-10-12 ENCOUNTER — Encounter: Payer: Self-pay | Admitting: Infectious Diseases

## 2019-10-12 ENCOUNTER — Ambulatory Visit (INDEPENDENT_AMBULATORY_CARE_PROVIDER_SITE_OTHER): Payer: No Typology Code available for payment source | Admitting: Infectious Diseases

## 2019-10-12 VITALS — BP 148/92 | HR 76 | Temp 98.3°F | Wt 263.0 lb

## 2019-10-12 DIAGNOSIS — Z113 Encounter for screening for infections with a predominantly sexual mode of transmission: Secondary | ICD-10-CM | POA: Diagnosis not present

## 2019-10-12 DIAGNOSIS — K5730 Diverticulosis of large intestine without perforation or abscess without bleeding: Secondary | ICD-10-CM

## 2019-10-12 DIAGNOSIS — M48061 Spinal stenosis, lumbar region without neurogenic claudication: Secondary | ICD-10-CM

## 2019-10-12 DIAGNOSIS — I10 Essential (primary) hypertension: Secondary | ICD-10-CM | POA: Diagnosis not present

## 2019-10-12 DIAGNOSIS — Z23 Encounter for immunization: Secondary | ICD-10-CM | POA: Diagnosis not present

## 2019-10-12 DIAGNOSIS — E669 Obesity, unspecified: Secondary | ICD-10-CM

## 2019-10-12 DIAGNOSIS — B20 Human immunodeficiency virus [HIV] disease: Secondary | ICD-10-CM

## 2019-10-12 DIAGNOSIS — Z79899 Other long term (current) drug therapy: Secondary | ICD-10-CM

## 2019-10-12 NOTE — Assessment & Plan Note (Signed)
shingrix at [facility].  Got flu today pcv 23 today.  Doing well with his ART.  Offered/refused condoms.  Will get him into PCP, [place] at [facility].  rtc in 9 months.

## 2019-10-12 NOTE — Progress Notes (Signed)
   Subjective:    Patient ID: SHAFT TIA, male    DOB: May 19, 1961, 58 y.o.   MRN: [id number]  HPI 58yo M with hx of HIV+ and HTN. On norvasc, atripla --->odefsy.  Had L5-S1 micro-decompression on 12-04-18. Pain has been 2-3.  Has been taking maalox for his GI upset, constipation. Having cramping and gas as well.    Spacing odefsey 12h from his GERD rx.  Had 2 EGD last year, colon last 2014. also having arthritis pain, in hands. Has been using Voltaren gel (prev for his back). Using z-quil for sleep.  Having LE swelling.  Can't work at [employer]. Is going to be seen for disability.  Is seen at [facility]. Does not have a regular PCP.  Has new umbilical hernia. Can reduce.  " I'm just falling apart".   HIV 1 RNA Quant (copies/mL)  Date Value  09/28/2019 <20 NOT DETECTED  12/03/2018 40 (H)  03/05/2018 <20 NOT DETECTED   CD4 T Cell Abs (/uL)  Date Value  09/28/2019 720  12/03/2018 600  03/05/2018 690     Review of Systems  Constitutional: Negative for appetite change and unexpected weight change.  Respiratory: Negative for cough and shortness of breath.   Gastrointestinal: Positive for abdominal pain and constipation. Negative for diarrhea.  Genitourinary: Negative for difficulty urinating.  Musculoskeletal: Positive for arthralgias.  Psychiatric/Behavioral: Positive for sleep disturbance.  watching diet. Does exercise on treadmill, limited to 10 min/day.  Please see HPI. All other systems reviewed and negative.    Objective:   Physical Exam Constitutional:      Appearance: Normal appearance. He is obese.  HENT:     Mouth/Throat:     Mouth: Mucous membranes are moist.     Pharynx: No oropharyngeal exudate.  Eyes:     Extraocular Movements: Extraocular movements intact.     Pupils: Pupils are equal, round, and reactive to light.  Neck:     Musculoskeletal: Normal range of motion and neck supple.  Cardiovascular:     Rate and Rhythm: Normal rate and  regular rhythm.  Pulmonary:     Effort: Pulmonary effort is normal.     Breath sounds: Normal breath sounds.  Abdominal:     General: Abdomen is flat. Bowel sounds are normal. There is no distension.     Palpations: Abdomen is soft.     Tenderness: There is no abdominal tenderness.  Musculoskeletal:     Right lower leg: No edema.     Left lower leg: No edema.  Neurological:     General: No focal deficit present.     Mental Status: He is alert and oriented to person, place, and time.  Psychiatric:        Mood and Affect: Mood normal.       Assessment & Plan:

## 2019-10-12 NOTE — Assessment & Plan Note (Signed)
Mildly up today Will get him in with PCP.

## 2019-10-12 NOTE — Addendum Note (Signed)
Addended by: Aundria Rud on: 10/12/2019 12:23 PM   Modules accepted: Orders

## 2019-10-12 NOTE — Assessment & Plan Note (Signed)
Need to get his arthritis under control to improve this.  Watch diet.

## 2019-10-12 NOTE — Assessment & Plan Note (Signed)
He will f/u with Dr Maxie Better regading his pain and possible disability eval.

## 2019-10-12 NOTE — Assessment & Plan Note (Signed)
Repeat Colon 2024

## 2019-10-21 ENCOUNTER — Encounter: Payer: Self-pay | Admitting: Internal Medicine

## 2019-10-21 ENCOUNTER — Other Ambulatory Visit: Payer: Self-pay

## 2019-10-21 ENCOUNTER — Ambulatory Visit: Payer: PRIVATE HEALTH INSURANCE | Admitting: Internal Medicine

## 2019-10-21 VITALS — BP 140/80 | HR 94 | Temp 97.8°F | Ht 69.0 in | Wt 266.6 lb

## 2019-10-21 DIAGNOSIS — B20 Human immunodeficiency virus [HIV] disease: Secondary | ICD-10-CM

## 2019-10-21 DIAGNOSIS — Z1211 Encounter for screening for malignant neoplasm of colon: Secondary | ICD-10-CM

## 2019-10-21 DIAGNOSIS — K429 Umbilical hernia without obstruction or gangrene: Secondary | ICD-10-CM

## 2019-10-21 DIAGNOSIS — A6000 Herpesviral infection of urogenital system, unspecified: Secondary | ICD-10-CM

## 2019-10-21 DIAGNOSIS — I10 Essential (primary) hypertension: Secondary | ICD-10-CM

## 2019-10-21 DIAGNOSIS — M5126 Other intervertebral disc displacement, lumbar region: Secondary | ICD-10-CM

## 2019-10-21 NOTE — Patient Instructions (Signed)
-Nice seeing you today!!  -Schedule return visit for physical. Please come in fasting.   Mediterranean Diet A Mediterranean diet refers to food and lifestyle choices that are based on the traditions of countries located on the [employer]. This way of eating has been shown to help prevent certain conditions and improve outcomes for people who have chronic diseases, like kidney disease and heart disease. What are tips for following this plan? Lifestyle  Cook and eat meals together with your family, when possible.  Drink enough fluid to keep your urine clear or pale yellow.  Be physically active every day. This includes: ? Aerobic exercise like running or swimming. ? Leisure activities like gardening, walking, or housework.  Get 7-8 hours of sleep each night.  If recommended by your health care provider, drink red wine in moderation. This means 1 glass a day for nonpregnant women and 2 glasses a day for men. A glass of wine equals 5 oz (150 mL). Reading food labels   Check the serving size of packaged foods. For foods such as rice and pasta, the serving size refers to the amount of cooked product, not dry.  Check the total fat in packaged foods. Avoid foods that have saturated fat or trans fats.  Check the ingredients list for added sugars, such as corn syrup. Shopping  At the grocery store, buy most of your food from the areas near the walls of the store. This includes: ? Fresh fruits and vegetables (produce). ? Grains, beans, nuts, and seeds. Some of these may be available in unpackaged forms or large amounts (in bulk). ? Fresh seafood. ? Poultry and eggs. ? Low-fat dairy products.  Buy whole ingredients instead of prepackaged foods.  Buy fresh fruits and vegetables in-season from local farmers markets.  Buy frozen fruits and vegetables in resealable bags.  If you do not have access to quality fresh seafood, buy precooked frozen shrimp or canned fish, such as tuna,  salmon, or sardines.  Buy small amounts of raw or cooked vegetables, salads, or olives from the deli or salad bar at your store.  Stock your pantry so you always have certain foods on hand, such as olive oil, canned tuna, canned tomatoes, rice, pasta, and beans. Cooking  Cook foods with extra-virgin olive oil instead of using butter or other vegetable oils.  Have meat as a side dish, and have vegetables or grains as your main dish. This means having meat in small portions or adding small amounts of meat to foods like pasta or stew.  Use beans or vegetables instead of meat in common dishes like chili or lasagna.  Experiment with different cooking methods. Try roasting or broiling vegetables instead of steaming or sauteing them.  Add frozen vegetables to soups, stews, pasta, or rice.  Add nuts or seeds for added healthy fat at each meal. You can add these to yogurt, salads, or vegetable dishes.  Marinate fish or vegetables using olive oil, lemon juice, garlic, and fresh herbs. Meal planning   Plan to eat 1 vegetarian meal one day each week. Try to work up to 2 vegetarian meals, if possible.  Eat seafood 2 or more times a week.  Have healthy snacks readily available, such as: ? Vegetable sticks with hummus. ? [place] yogurt. ? Fruit and nut trail mix.  Eat balanced meals throughout the week. This includes: ? Fruit: 2-3 servings a day ? Vegetables: 4-5 servings a day ? Low-fat dairy: 2 servings a day ? Fish, poultry, or  lean meat: 1 serving a day ? Beans and legumes: 2 or more servings a week ? Nuts and seeds: 1-2 servings a day ? Whole grains: 6-8 servings a day ? Extra-virgin olive oil: 3-4 servings a day  Limit red meat and sweets to only a few servings a month What are my food choices?  Mediterranean diet ? Recommended  Grains: Whole-grain pasta. Brown rice. Bulgar wheat. Polenta. Couscous. Whole-wheat bread. [place].  Vegetables: Artichokes. Beets. Broccoli.  Cabbage. Carrots. Eggplant. Green beans. Chard. Kale. Spinach. Onions. Leeks. Peas. Squash. Tomatoes. Peppers. Radishes.  Fruits: Apples. Apricots. Avocado. Berries. Bananas. Cherries. Dates. Figs. Grapes. Lemons. Melon. Oranges. Peaches. Plums. Pomegranate.  Meats and other protein foods: Beans. Almonds. Sunflower seeds. Pine nuts. Peanuts. [place]. Salmon. Scallops. Shrimp. [place]. Tilapia. Clams. Oysters. Eggs.  Dairy: Low-fat milk. Cheese. Greek yogurt.  Beverages: Water. Red wine. Herbal tea.  Fats and oils: Extra virgin olive oil. Avocado oil. Grape seed oil.  Sweets and desserts: [place] yogurt with honey. Baked apples. Poached pears. Trail mix.  Seasoning and other foods: Basil. Cilantro. Coriander. Cumin. Mint. Parsley. Sage. Rosemary. Tarragon. Garlic. Oregano. Thyme. Pepper. Balsalmic vinegar. Tahini. Hummus. Tomato sauce. Olives. Mushrooms. ? Limit these  Grains: Prepackaged pasta or rice dishes. Prepackaged cereal with added sugar.  Vegetables: Deep fried potatoes (french fries).  Fruits: Fruit canned in syrup.  Meats and other protein foods: Beef. Pork. Lamb. Poultry with skin. Hot dogs. Berniece Salines.  Dairy: Ice cream. Sour cream. Whole milk.  Beverages: Juice. Sugar-sweetened soft drinks. Beer. Liquor and spirits.  Fats and oils: Butter. Canola oil. Vegetable oil. Beef fat (tallow). Lard.  Sweets and desserts: Cookies. Cakes. Pies. Candy.  Seasoning and other foods: Mayonnaise. Premade sauces and marinades. The items listed may not be a complete list. Talk with your dietitian about what dietary choices are right for you. Summary  The Mediterranean diet includes both food and lifestyle choices.  Eat a variety of fresh fruits and vegetables, beans, nuts, seeds, and whole grains.  Limit the amount of red meat and sweets that you eat.  Talk with your health care provider about whether it is safe for you to drink red wine in moderation. This means 1 glass a day for nonpregnant  women and 2 glasses a day for men. A glass of wine equals 5 oz (150 mL). This information is not intended to replace advice given to you by your health care provider. Make sure you discuss any questions you have with your health care provider. Document Released: 07/26/2016 Document Revised: 08/02/2016 Document Reviewed: 07/26/2016 Elsevier Patient Education  2020 [employer].

## 2019-10-21 NOTE — Progress Notes (Signed)
New Patient Office Visit     CC/Reason for Visit: Establish care, discuss chronic conditions Previous PCP: Dr. Lita Mains Last Visit: October 2020  HPI: Edward Hendricks is a 58 y.o. male who is coming in today for the above mentioned reasons. Past Medical History is significant for: HIV followed by Dr. Johnnye Sima and has been well controlled, history of peptic ulcer disease/GERD and also what sounds like Barrett's esophagus.  He is currently establishing care with GI Dr. Carlean Purl to determine appropriate follow-up timing for EGD.  He has a history of hypertension that does not appear to be well controlled on Norvasc.  He had an L5/S1 decompression in December 2019 by Dr. Susa Day.  He feels like he is "falling apart" after he turned 53.  He knows that he is overweight and would like to lose some weight but is having difficulty calming he feels very fatigued, he has an umbilical hernia that he would like assessed.  He used to work at [employer] but is now out of work after his lumbar decompression, he is a never smoker, has 2 mixed drinks a week, has allergies to amoxicillin, is married to same-sex partner with no children, has no family history of significance.   Past Medical/Surgical History: Past Medical History:  Diagnosis Date  . Arthritis    "hands" (12/04/2018)  . GERD (gastroesophageal reflux disease)   . HIV (human immunodeficiency virus infection) ([place]) dx'd 2002  . Hypertension   . IBS (irritable bowel syndrome)   . Personal history of colonic adenomas 03/10/2013  . Pneumonia 1990s    from HIV    Past Surgical History:  Procedure Laterality Date  . BACK SURGERY    . COLONOSCOPY W/ BIOPSIES AND POLYPECTOMY    . ESOPHAGOGASTRODUODENOSCOPY     x2  . LUMBAR LAMINECTOMY/DECOMPRESSION MICRODISCECTOMY  12/04/2018   L5-S1  . LUMBAR LAMINECTOMY/DECOMPRESSION MICRODISCECTOMY N/A 12/04/2018   Procedure: Microlumbar decompression L5-S1;  Surgeon: Susa Day, MD;  Location:  [place];  Service: Orthopedics;  Laterality: N/A;  120 mins  . TONSILLECTOMY    . WISDOM TOOTH EXTRACTION  2009    Social History:  reports that he has never smoked. He has never used smokeless tobacco. He reports previous alcohol use. He reports that he does not use drugs.  Allergies: Allergies  Allergen Reactions  . Amoxicillin Rash    Family History:  Family History  Problem Relation Age of Onset  . Depression Mother        previously hospitalized  . Colon cancer Neg Hx      Current Outpatient Medications:  .  amLODipine (NORVASC) 10 MG tablet, TAKE 1 TABLET BY MOUTH EVERY DAY, Disp: 90 tablet, Rfl: 2 .  diclofenac sodium (VOLTAREN) 1 % GEL, Apply 1 application topically at bedtime., Disp: , Rfl:  .  docusate sodium (COLACE) 100 MG capsule, Take 1 capsule (100 mg total) by mouth 2 (two) times daily., Disp: 40 capsule, Rfl: 2 .  famotidine (PEPCID) 40 MG tablet, Take 40 mg by mouth at bedtime., Disp: , Rfl:  .  fexofenadine (ALLEGRA) 180 MG tablet, Take 180 mg by mouth daily.  , Disp: , Rfl:  .  ODEFSEY 200-25-25 MG TABS tablet, TAKE 1 TABLET BY MOUTH ONCE DAILY WITH BREAKFAST, Disp: 90 tablet, Rfl: 2 .  pantoprazole (PROTONIX) 40 MG tablet, Take 40 mg by mouth daily., Disp: , Rfl:  .  polyethylene glycol (MIRALAX) packet, Take 17 g by mouth daily., Disp: 14 each, Rfl:  0  Review of Systems:  Constitutional: Denies fever, chills, diaphoresis, appetite change. HEENT: Denies photophobia, eye pain, redness, hearing loss, ear pain, congestion, sore throat, rhinorrhea, sneezing, mouth sores, trouble swallowing, neck pain, neck stiffness and tinnitus.   Respiratory: Denies SOB, DOE, cough, chest tightness,  and wheezing.   Cardiovascular: Denies chest pain, palpitations and leg swelling.  Gastrointestinal: Denies nausea, vomiting, abdominal pain, diarrhea, constipation, blood in stool and abdominal distention.  Genitourinary: Denies dysuria, urgency, frequency, hematuria, flank pain  and difficulty urinating.  Endocrine: Denies: hot or cold intolerance, sweats, changes in hair or nails, polyuria, polydipsia. Musculoskeletal: Denies myalgias, joint swelling, arthralgias and gait problem.  Skin: Denies pallor, rash and wound.  Neurological: Denies dizziness, seizures, syncope, weakness, light-headedness, numbness and headaches.  Hematological: Denies adenopathy. Easy bruising, personal or family bleeding history  Psychiatric/Behavioral: Denies suicidal ideation, mood changes, confusion, nervousness, sleep disturbance and agitation    Physical Exam: Vitals:   10/21/19 1137  BP: 140/80  Pulse: 94  Temp: 97.8 F (36.6 C)  TempSrc: Temporal  SpO2: 96%  Weight: 266 lb 9.6 oz (120.9 kg)  Height: 5\' 9"  (1.753 m)   Body mass index is 39.37 kg/m.  Constitutional: NAD, calm, comfortable obese, Eyes: PERRL, lids and conjunctivae normal ENMT: Mucous membranes are moist.  Neck: normal, supple, no masses, no thyromegaly Respiratory: clear to auscultation bilaterally, no wheezing, no crackles. Normal respiratory effort. No accessory muscle use.  Cardiovascular: Regular rate and rhythm, no murmurs / rubs / gallops. No extremity edema. 2+ pedal pulses.  Abdomen: no tenderness, no masses palpated. No hepatosplenomegaly. Bowel sounds positive.  Musculoskeletal: no clubbing / cyanosis. No joint deformity upper and lower extremities. Good ROM, no contractures. Normal muscle tone.  Skin: no rashes, lesions, ulcers. No induration Neurologic: Grossly intact and nonfocal Psychiatric: Normal judgment and insight. Alert and oriented x 3. Normal mood.    Impression and Plan:  Essential hypertension -Blood pressure is not well controlled today, was also around the same range (148/92) at recent visit with ID. -Continue Norvasc, he will do ambulatory blood pressure monitoring and return within 4 to 6 weeks for follow-up.  HNP (herniated nucleus pulposus), lumbar -Status post  decompression, followed by orthopedics.  Morbid obesity ([place]) -Discussed healthy lifestyle, including increased physical activity and better food choices to promote weight loss. -Patient was given handout on Mediterranean diet, can consider referral to healthy weight and wellness or bariatric clinic at follow-up visit.  Human immunodeficiency virus (HIV) disease ([place]) -Most recent viral load was undetectable.  Genital herpes simplex, unspecified site -No recent flareups, not on chronic suppressive therapy  Umbilical hernia without obstruction and without gangrene -Easily reducible, we have discussed indications for surgery would be pain, signs of bowel obstruction, otherwise will observe.  Weight loss may also help with reduction of the hernia.     Patient Instructions  -Nice seeing you today!!  -Schedule return visit for physical. Please come in fasting.   Mediterranean Diet A Mediterranean diet refers to food and lifestyle choices that are based on the traditions of countries located on the [employer]. This way of eating has been shown to help prevent certain conditions and improve outcomes for people who have chronic diseases, like kidney disease and heart disease. What are tips for following this plan? Lifestyle  Cook and eat meals together with your family, when possible.  Drink enough fluid to keep your urine clear or pale yellow.  Be physically active every day. This includes: ? Aerobic exercise like  running or swimming. ? Leisure activities like gardening, walking, or housework.  Get 7-8 hours of sleep each night.  If recommended by your health care provider, drink red wine in moderation. This means 1 glass a day for nonpregnant women and 2 glasses a day for men. A glass of wine equals 5 oz (150 mL). Reading food labels   Check the serving size of packaged foods. For foods such as rice and pasta, the serving size refers to the amount of cooked product, not  dry.  Check the total fat in packaged foods. Avoid foods that have saturated fat or trans fats.  Check the ingredients list for added sugars, such as corn syrup. Shopping  At the grocery store, buy most of your food from the areas near the walls of the store. This includes: ? Fresh fruits and vegetables (produce). ? Grains, beans, nuts, and seeds. Some of these may be available in unpackaged forms or large amounts (in bulk). ? Fresh seafood. ? Poultry and eggs. ? Low-fat dairy products.  Buy whole ingredients instead of prepackaged foods.  Buy fresh fruits and vegetables in-season from local farmers markets.  Buy frozen fruits and vegetables in resealable bags.  If you do not have access to quality fresh seafood, buy precooked frozen shrimp or canned fish, such as tuna, salmon, or sardines.  Buy small amounts of raw or cooked vegetables, salads, or olives from the deli or salad bar at your store.  Stock your pantry so you always have certain foods on hand, such as olive oil, canned tuna, canned tomatoes, rice, pasta, and beans. Cooking  Cook foods with extra-virgin olive oil instead of using butter or other vegetable oils.  Have meat as a side dish, and have vegetables or grains as your main dish. This means having meat in small portions or adding small amounts of meat to foods like pasta or stew.  Use beans or vegetables instead of meat in common dishes like chili or lasagna.  Experiment with different cooking methods. Try roasting or broiling vegetables instead of steaming or sauteing them.  Add frozen vegetables to soups, stews, pasta, or rice.  Add nuts or seeds for added healthy fat at each meal. You can add these to yogurt, salads, or vegetable dishes.  Marinate fish or vegetables using olive oil, lemon juice, garlic, and fresh herbs. Meal planning   Plan to eat 1 vegetarian meal one day each week. Try to work up to 2 vegetarian meals, if possible.  Eat seafood 2  or more times a week.  Have healthy snacks readily available, such as: ? Vegetable sticks with hummus. ? [place] yogurt. ? Fruit and nut trail mix.  Eat balanced meals throughout the week. This includes: ? Fruit: 2-3 servings a day ? Vegetables: 4-5 servings a day ? Low-fat dairy: 2 servings a day ? Fish, poultry, or lean meat: 1 serving a day ? Beans and legumes: 2 or more servings a week ? Nuts and seeds: 1-2 servings a day ? Whole grains: 6-8 servings a day ? Extra-virgin olive oil: 3-4 servings a day  Limit red meat and sweets to only a few servings a month What are my food choices?  Mediterranean diet ? Recommended  Grains: Whole-grain pasta. Brown rice. Bulgar wheat. Polenta. Couscous. Whole-wheat bread. [place].  Vegetables: Artichokes. Beets. Broccoli. Cabbage. Carrots. Eggplant. Green beans. Chard. Kale. Spinach. Onions. Leeks. Peas. Squash. Tomatoes. Peppers. Radishes.  Fruits: Apples. Apricots. Avocado. Berries. Bananas. Cherries. Dates. Figs. Grapes. Lemons. Melon.  Oranges. Peaches. Plums. Pomegranate.  Meats and other protein foods: Beans. Almonds. Sunflower seeds. Pine nuts. Peanuts. [place]. Salmon. Scallops. Shrimp. [place]. Tilapia. Clams. Oysters. Eggs.  Dairy: Low-fat milk. Cheese. Greek yogurt.  Beverages: Water. Red wine. Herbal tea.  Fats and oils: Extra virgin olive oil. Avocado oil. Grape seed oil.  Sweets and desserts: [place] yogurt with honey. Baked apples. Poached pears. Trail mix.  Seasoning and other foods: Basil. Cilantro. Coriander. Cumin. Mint. Parsley. Sage. Rosemary. Tarragon. Garlic. Oregano. Thyme. Pepper. Balsalmic vinegar. Tahini. Hummus. Tomato sauce. Olives. Mushrooms. ? Limit these  Grains: Prepackaged pasta or rice dishes. Prepackaged cereal with added sugar.  Vegetables: Deep fried potatoes (french fries).  Fruits: Fruit canned in syrup.  Meats and other protein foods: Beef. Pork. Lamb. Poultry with skin. Hot dogs. Berniece Salines.   Dairy: Ice cream. Sour cream. Whole milk.  Beverages: Juice. Sugar-sweetened soft drinks. Beer. Liquor and spirits.  Fats and oils: Butter. Canola oil. Vegetable oil. Beef fat (tallow). Lard.  Sweets and desserts: Cookies. Cakes. Pies. Candy.  Seasoning and other foods: Mayonnaise. Premade sauces and marinades. The items listed may not be a complete list. Talk with your dietitian about what dietary choices are right for you. Summary  The Mediterranean diet includes both food and lifestyle choices.  Eat a variety of fresh fruits and vegetables, beans, nuts, seeds, and whole grains.  Limit the amount of red meat and sweets that you eat.  Talk with your health care provider about whether it is safe for you to drink red wine in moderation. This means 1 glass a day for nonpregnant women and 2 glasses a day for men. A glass of wine equals 5 oz (150 mL). This information is not intended to replace advice given to you by your health care provider. Make sure you discuss any questions you have with your health care provider. Document Released: 07/26/2016 Document Revised: 08/02/2016 Document Reviewed: 07/26/2016 Elsevier Patient Education  2020 [place], MD [facility]

## 2019-11-05 ENCOUNTER — Other Ambulatory Visit: Payer: Self-pay

## 2019-11-06 ENCOUNTER — Ambulatory Visit (INDEPENDENT_AMBULATORY_CARE_PROVIDER_SITE_OTHER): Payer: PRIVATE HEALTH INSURANCE | Admitting: Internal Medicine

## 2019-11-06 ENCOUNTER — Encounter: Payer: Self-pay | Admitting: Internal Medicine

## 2019-11-06 VITALS — BP 120/80 | HR 80 | Temp 98.2°F | Ht 69.0 in | Wt 265.5 lb

## 2019-11-06 DIAGNOSIS — Z1211 Encounter for screening for malignant neoplasm of colon: Secondary | ICD-10-CM | POA: Diagnosis not present

## 2019-11-06 DIAGNOSIS — B20 Human immunodeficiency virus [HIV] disease: Secondary | ICD-10-CM

## 2019-11-06 DIAGNOSIS — E781 Pure hyperglyceridemia: Secondary | ICD-10-CM | POA: Diagnosis not present

## 2019-11-06 DIAGNOSIS — Z0000 Encounter for general adult medical examination without abnormal findings: Secondary | ICD-10-CM | POA: Diagnosis not present

## 2019-11-06 DIAGNOSIS — H539 Unspecified visual disturbance: Secondary | ICD-10-CM

## 2019-11-06 DIAGNOSIS — I10 Essential (primary) hypertension: Secondary | ICD-10-CM | POA: Diagnosis not present

## 2019-11-06 DIAGNOSIS — M5126 Other intervertebral disc displacement, lumbar region: Secondary | ICD-10-CM

## 2019-11-06 DIAGNOSIS — M48061 Spinal stenosis, lumbar region without neurogenic claudication: Secondary | ICD-10-CM

## 2019-11-06 LAB — COMPREHENSIVE METABOLIC PANEL
ALT: 45 U/L (ref 0–53)
AST: 24 U/L (ref 0–37)
Albumin: 4.7 g/dL (ref 3.5–5.2)
Alkaline Phosphatase: 90 U/L (ref 39–117)
BUN: 17 mg/dL (ref 6–23)
CO2: 25 mEq/L (ref 19–32)
Calcium: 9.2 mg/dL (ref 8.4–10.5)
Chloride: 103 mEq/L (ref 96–112)
Creatinine, Ser: 0.96 mg/dL (ref 0.40–1.50)
GFR: 80.64 mL/min (ref >60.00)
Glucose, Bld: 95 mg/dL (ref 70–99)
Potassium: 3.9 mEq/L (ref 3.5–5.1)
Sodium: 137 mEq/L (ref 135–145)
Total Bilirubin: 0.6 mg/dL (ref 0.2–1.2)
Total Protein: 7 g/dL (ref 6.0–8.3)

## 2019-11-06 LAB — CBC WITH DIFFERENTIAL/PLATELET
Basophils Absolute: 0 10*3/uL (ref 0.0–0.1)
Basophils Relative: 0.6 % (ref 0.0–3.0)
Eosinophils Absolute: 0.1 10*3/uL (ref 0.0–0.7)
Eosinophils Relative: 1 % (ref 0.0–5.0)
HCT: 45.4 % (ref 39.0–52.0)
Hemoglobin: 15.9 g/dL (ref 13.0–17.0)
Lymphocytes Relative: 30.7 % (ref 12.0–46.0)
Lymphs Abs: 1.9 10*3/uL (ref 0.7–4.0)
MCHC: 34.9 g/dL (ref 30.0–36.0)
MCV: 91.7 fl (ref 78.0–100.0)
Monocytes Absolute: 0.5 10*3/uL (ref 0.1–1.0)
Monocytes Relative: 7.9 % (ref 3.0–12.0)
Neutro Abs: 3.8 10*3/uL (ref 1.4–7.7)
Neutrophils Relative %: 59.8 % (ref 43.0–77.0)
Platelets: 184 10*3/uL (ref 150.0–400.0)
RBC: 4.95 Mil/uL (ref 4.22–5.81)
RDW: 13 % (ref 11.5–15.5)
WBC: 6.3 10*3/uL (ref 4.0–10.5)

## 2019-11-06 LAB — LIPID PANEL
Cholesterol: 236 mg/dL — ABNORMAL HIGH (ref 0–200)
HDL: 45 mg/dL (ref >39.00)
LDL Cholesterol: 168 mg/dL — ABNORMAL HIGH (ref 0–99)
NonHDL: 191.1
Total CHOL/HDL Ratio: 5
Triglycerides: 118 mg/dL (ref 0.0–149.0)
VLDL: 23.6 mg/dL (ref 0.0–40.0)

## 2019-11-06 LAB — TSH: TSH: 2.2 u[IU]/mL (ref 0.35–4.50)

## 2019-11-06 LAB — VITAMIN B12: Vitamin B-12: 293 pg/mL (ref 211–911)

## 2019-11-06 LAB — VITAMIN D 25 HYDROXY (VIT D DEFICIENCY, FRACTURES): VITD: 56.29 ng/mL (ref 30.00–100.00)

## 2019-11-06 LAB — PSA: PSA: 1.02 ng/mL (ref 0.10–4.00)

## 2019-11-06 LAB — HEMOGLOBIN A1C: Hgb A1c MFr Bld: 5.1 % (ref 4.6–6.5)

## 2019-11-06 NOTE — Patient Instructions (Signed)
-Nice seeing you today!!  -Lab work today; will notify you once results are available.  -GI an eye referrals will be sent today.  -Schedule follow up in 3 -4 months, please bring in your blood pressure log.   Preventive Care 4-57 Years Old, Male Preventive care refers to lifestyle choices and visits with your health care provider that can promote health and wellness. This includes:  A yearly physical exam. This is also called an annual well check.  Regular dental and eye exams.  Immunizations.  Screening for certain conditions.  Healthy lifestyle choices, such as eating a healthy diet, getting regular exercise, not using drugs or products that contain nicotine and tobacco, and limiting alcohol use. What can I expect for my preventive care visit? Physical exam Your health care provider will check:  Height and weight. These may be used to calculate body mass index (BMI), which is a measurement that tells if you are at a healthy weight.  Heart rate and blood pressure.  Your skin for abnormal spots. Counseling Your health care provider may ask you questions about:  Alcohol, tobacco, and drug use.  Emotional well-being.  Home and relationship well-being.  Sexual activity.  Eating habits.  Work and work Statistician. What immunizations do I need?  Influenza (flu) vaccine  This is recommended every year. Tetanus, diphtheria, and pertussis (Tdap) vaccine  You may need a Td booster every 10 years. Varicella (chickenpox) vaccine  You may need this vaccine if you have not already been vaccinated. Zoster (shingles) vaccine  You may need this after age 41. Measles, mumps, and rubella (MMR) vaccine  You may need at least one dose of MMR if you were born in 1957 or later. You may also need a second dose. Pneumococcal conjugate (PCV13) vaccine  You may need this if you have certain conditions and were not previously vaccinated. Pneumococcal polysaccharide (PPSV23)  vaccine  You may need one or two doses if you smoke cigarettes or if you have certain conditions. Meningococcal conjugate (MenACWY) vaccine  You may need this if you have certain conditions. Hepatitis A vaccine  You may need this if you have certain conditions or if you travel or work in places where you may be exposed to hepatitis A. Hepatitis B vaccine  You may need this if you have certain conditions or if you travel or work in places where you may be exposed to hepatitis B. Haemophilus influenzae type b (Hib) vaccine  You may need this if you have certain risk factors. Human papillomavirus (HPV) vaccine  If recommended by your health care provider, you may need three doses over 6 months. You may receive vaccines as individual doses or as more than one vaccine together in one shot (combination vaccines). Talk with your health care provider about the risks and benefits of combination vaccines. What tests do I need? Blood tests  Lipid and cholesterol levels. These may be checked every 5 years, or more frequently if you are over 75 years old.  Hepatitis C test.  Hepatitis B test. Screening  Lung cancer screening. You may have this screening every year starting at age 22 if you have a 30-pack-year history of smoking and currently smoke or have quit within the past 15 years.  Prostate cancer screening. Recommendations will vary depending on your family history and other risks.  Colorectal cancer screening. All adults should have this screening starting at age 38 and continuing until age 85. Your health care provider may recommend screening at age  45 if you are at increased risk. You will have tests every 1-10 years, depending on your results and the type of screening test.  Diabetes screening. This is done by checking your blood sugar (glucose) after you have not eaten for a while (fasting). You may have this done every 1-3 years.  Sexually transmitted disease (STD) testing.  Follow these instructions at home: Eating and drinking  Eat a diet that includes fresh fruits and vegetables, whole grains, lean protein, and low-fat dairy products.  Take vitamin and mineral supplements as recommended by your health care provider.  Do not drink alcohol if your health care provider tells you not to drink.  If you drink alcohol: ? Limit how much you have to 0-2 drinks a day. ? Be aware of how much alcohol is in your drink. In the [place] one drink equals one 12 oz bottle of beer (355 mL), one 5 oz glass of wine (148 mL), or one 1 oz glass of hard liquor (44 mL). Lifestyle  Take daily care of your teeth and gums.  Stay active. Exercise for at least 30 minutes on 5 or more days each week.  Do not use any products that contain nicotine or tobacco, such as cigarettes, e-cigarettes, and chewing tobacco. If you need help quitting, ask your health care provider.  If you are sexually active, practice safe sex. Use a condom or other form of protection to prevent STIs (sexually transmitted infections).  Talk with your health care provider about taking a low-dose aspirin every day starting at age 62. What's next?  Go to your health care provider once a year for a well check visit.  Ask your health care provider how often you should have your eyes and teeth checked.  Stay up to date on all vaccines. This information is not intended to replace advice given to you by your health care provider. Make sure you discuss any questions you have with your health care provider. Document Released: 12/30/2015 Document Revised: 11/27/2018 Document Reviewed: 11/27/2018 Elsevier Patient Education  2020 [employer].

## 2019-11-06 NOTE — Progress Notes (Signed)
Established Patient Office Visit     CC/Reason for Visit: Annual preventive exam  HPI: Edward Hendricks is a 58 y.o. male who is coming in today for the above mentioned reasons. Past Medical History is significant for:  HIV followed by Dr. Johnnye Sima and has been well controlled, history of peptic ulcer disease/GERD and also what sounds like Barrett's esophagus.  He is currently establishing care with GI Dr. Carlean Purl to determine appropriate follow-up timing for EGD.  He has a history of hypertension that does not appear to be well controlled on Norvasc.  He had an L5/S1 decompression in December 2019 by Dr. Susa Day.   He has no acute complaints today.  He has routine dental care but has not seen an eye doctor in over 2 years.  He is overdue for his repeat colonoscopy, was last done in 2014 and is a 5-year callback.  Last week he had a steroid injection to a trigger finger in his right hand.  Past Medical/Surgical History: Past Medical History:  Diagnosis Date  . Arthritis    "hands" (12/04/2018)  . GERD (gastroesophageal reflux disease)   . HIV (human immunodeficiency virus infection) ([place]) dx'd 2002  . Hypertension   . IBS (irritable bowel syndrome)   . Personal history of colonic adenomas 03/10/2013  . Pneumonia 1990s    from HIV    Past Surgical History:  Procedure Laterality Date  . BACK SURGERY    . COLONOSCOPY W/ BIOPSIES AND POLYPECTOMY    . ESOPHAGOGASTRODUODENOSCOPY     x2  . LUMBAR LAMINECTOMY/DECOMPRESSION MICRODISCECTOMY  12/04/2018   L5-S1  . LUMBAR LAMINECTOMY/DECOMPRESSION MICRODISCECTOMY N/A 12/04/2018   Procedure: Microlumbar decompression L5-S1;  Surgeon: Susa Day, MD;  Location: [facility];  Service: Orthopedics;  Laterality: N/A;  120 mins  . TONSILLECTOMY    . WISDOM TOOTH EXTRACTION  2009    Social History:  reports that he has never smoked. He has never used smokeless tobacco. He reports previous alcohol use. He reports that he does not use  drugs.  Allergies: Allergies  Allergen Reactions  . Amoxicillin Rash    Family History:  Family History  Problem Relation Age of Onset  . Depression Mother        previously hospitalized  . Colon cancer Neg Hx      Current Outpatient Medications:  .  amLODipine (NORVASC) 10 MG tablet, TAKE 1 TABLET BY MOUTH EVERY DAY, Disp: 90 tablet, Rfl: 2 .  diclofenac sodium (VOLTAREN) 1 % GEL, Apply 1 application topically at bedtime., Disp: , Rfl:  .  docusate sodium (COLACE) 100 MG capsule, Take 1 capsule (100 mg total) by mouth 2 (two) times daily., Disp: 40 capsule, Rfl: 2 .  famotidine (PEPCID) 40 MG tablet, Take 40 mg by mouth at bedtime., Disp: , Rfl:  .  fexofenadine (ALLEGRA) 180 MG tablet, Take 180 mg by mouth daily.  , Disp: , Rfl:  .  ODEFSEY 200-25-25 MG TABS tablet, TAKE 1 TABLET BY MOUTH ONCE DAILY WITH BREAKFAST, Disp: 90 tablet, Rfl: 2 .  pantoprazole (PROTONIX) 40 MG tablet, Take 40 mg by mouth daily., Disp: , Rfl:  .  polyethylene glycol (MIRALAX) packet, Take 17 g by mouth daily., Disp: 14 each, Rfl: 0  Review of Systems:  Constitutional: Denies fever, chills, diaphoresis, appetite change and fatigue.  HEENT: Denies photophobia, eye pain, redness, hearing loss, ear pain, congestion, sore throat, rhinorrhea, sneezing, mouth sores, trouble swallowing, neck pain, neck stiffness and tinnitus.  Respiratory: Denies SOB, DOE, cough, chest tightness,  and wheezing.   Cardiovascular: Denies chest pain, palpitations and leg swelling.  Gastrointestinal: Denies nausea, vomiting, abdominal pain, diarrhea, constipation, blood in stool and abdominal distention.  Genitourinary: Denies dysuria, urgency, frequency, hematuria, flank pain and difficulty urinating.  Endocrine: Denies: hot or cold intolerance, sweats, changes in hair or nails, polyuria, polydipsia. Musculoskeletal: Denies myalgias, back pain, joint swelling, arthralgias and gait problem.  Skin: Denies pallor, rash and wound.   Neurological: Denies dizziness, seizures, syncope, weakness, light-headedness, numbness and headaches.  Hematological: Denies adenopathy. Easy bruising, personal or family bleeding history  Psychiatric/Behavioral: Denies suicidal ideation, mood changes, confusion, nervousness, sleep disturbance and agitation    Physical Exam: Vitals:   11/06/19 0755  BP: 120/80  Pulse: 80  Temp: 98.2 F (36.8 C)  TempSrc: Temporal  SpO2: 96%  Weight: 265 lb 8 oz (120.4 kg)  Height: '5\' 9"'  (1.753 m)    Body mass index is 39.21 kg/m.   Constitutional: NAD, calm, comfortable Eyes: PERRL, lids and conjunctivae normal, wears corrective lenses ENMT: Mucous membranes are moist. Tympanic membrane is pearly white, no erythema or bulging. Neck: normal, supple, no masses, no thyromegaly Respiratory: clear to auscultation bilaterally, no wheezing, no crackles. Normal respiratory effort. No accessory muscle use.  Cardiovascular: Regular rate and rhythm, no murmurs / rubs / gallops. No extremity edema. 2+ pedal pulses. No carotid bruits.  Abdomen: Increased abdominal girth no tenderness, no masses palpated. No hepatosplenomegaly. Bowel sounds positive.  Musculoskeletal: no clubbing / cyanosis. No joint deformity upper and lower extremities. Good ROM, no contractures. Normal muscle tone.  Skin: no rashes, lesions, ulcers. No induration Neurologic: CN 2-12 grossly intact. Sensation intact, DTR normal. Strength 5/5 in all 4.  Psychiatric: Normal judgment and insight. Alert and oriented x 3. Normal mood.    Impression and Plan:  Encounter for preventive health examination  -He has routine dental care, will refer to ophthalmology today. -All immunizations are up-to-date. -Screening labs today. -Healthy lifestyle has been discussed in detail. -He is overdue for screening colonoscopy, was done in 2014 and is a 5-year callback. -Check PSA for prostate cancer screening.  Human immunodeficiency virus (HIV)  disease ([place]) -Followed by Dr. Johnnye Sima, on HAART.  Essential hypertension  -Blood pressure in office is well controlled today, he brings in his blood pressure log that shows blood pressures with systolics from 1 93-8 40 range, mostly in the 130 range with diastolics in the mid 18E on average. -Continue current medications.  Morbid obesity ([place]) -Discussed healthy lifestyle, including increased physical activity and better food choices to promote weight loss.  Spinal stenosis of lumbar region, unspecified whether neurogenic claudication present HNP (herniated nucleus pulposus), lumbar -Status post L5-S1 decompression in December 2019 by Dr. Susa Day.  Endogenous hyperlipemia  -Last LDL was 150, recheck lipids today.    Patient Instructions  -Nice seeing you today!!  -Lab work today; will notify you once results are available.  -GI an eye referrals will be sent today.  -Schedule follow up in 3 -4 months, please bring in your blood pressure log.   Preventive Care 4-12 Years Old, Male Preventive care refers to lifestyle choices and visits with your health care provider that can promote health and wellness. This includes:  A yearly physical exam. This is also called an annual well check.  Regular dental and eye exams.  Immunizations.  Screening for certain conditions.  Healthy lifestyle choices, such as eating a healthy diet, getting regular exercise, not using  drugs or products that contain nicotine and tobacco, and limiting alcohol use. What can I expect for my preventive care visit? Physical exam Your health care provider will check:  Height and weight. These may be used to calculate body mass index (BMI), which is a measurement that tells if you are at a healthy weight.  Heart rate and blood pressure.  Your skin for abnormal spots. Counseling Your health care provider may ask you questions about:  Alcohol, tobacco, and drug use.  Emotional well-being.   Home and relationship well-being.  Sexual activity.  Eating habits.  Work and work Statistician. What immunizations do I need?  Influenza (flu) vaccine  This is recommended every year. Tetanus, diphtheria, and pertussis (Tdap) vaccine  You may need a Td booster every 10 years. Varicella (chickenpox) vaccine  You may need this vaccine if you have not already been vaccinated. Zoster (shingles) vaccine  You may need this after age 17. Measles, mumps, and rubella (MMR) vaccine  You may need at least one dose of MMR if you were born in 1957 or later. You may also need a second dose. Pneumococcal conjugate (PCV13) vaccine  You may need this if you have certain conditions and were not previously vaccinated. Pneumococcal polysaccharide (PPSV23) vaccine  You may need one or two doses if you smoke cigarettes or if you have certain conditions. Meningococcal conjugate (MenACWY) vaccine  You may need this if you have certain conditions. Hepatitis A vaccine  You may need this if you have certain conditions or if you travel or work in places where you may be exposed to hepatitis A. Hepatitis B vaccine  You may need this if you have certain conditions or if you travel or work in places where you may be exposed to hepatitis B. Haemophilus influenzae type b (Hib) vaccine  You may need this if you have certain risk factors. Human papillomavirus (HPV) vaccine  If recommended by your health care provider, you may need three doses over 6 months. You may receive vaccines as individual doses or as more than one vaccine together in one shot (combination vaccines). Talk with your health care provider about the risks and benefits of combination vaccines. What tests do I need? Blood tests  Lipid and cholesterol levels. These may be checked every 5 years, or more frequently if you are over 26 years old.  Hepatitis C test.  Hepatitis B test. Screening  Lung cancer screening. You may have  this screening every year starting at age 32 if you have a 30-pack-year history of smoking and currently smoke or have quit within the past 15 years.  Prostate cancer screening. Recommendations will vary depending on your family history and other risks.  Colorectal cancer screening. All adults should have this screening starting at age 53 and continuing until age 50. Your health care provider may recommend screening at age 46 if you are at increased risk. You will have tests every 1-10 years, depending on your results and the type of screening test.  Diabetes screening. This is done by checking your blood sugar (glucose) after you have not eaten for a while (fasting). You may have this done every 1-3 years.  Sexually transmitted disease (STD) testing. Follow these instructions at home: Eating and drinking  Eat a diet that includes fresh fruits and vegetables, whole grains, lean protein, and low-fat dairy products.  Take vitamin and mineral supplements as recommended by your health care provider.  Do not drink alcohol if your health care provider  tells you not to drink.  If you drink alcohol: ? Limit how much you have to 0-2 drinks a day. ? Be aware of how much alcohol is in your drink. In the [place] one drink equals one 12 oz bottle of beer (355 mL), one 5 oz glass of wine (148 mL), or one 1 oz glass of hard liquor (44 mL). Lifestyle  Take daily care of your teeth and gums.  Stay active. Exercise for at least 30 minutes on 5 or more days each week.  Do not use any products that contain nicotine or tobacco, such as cigarettes, e-cigarettes, and chewing tobacco. If you need help quitting, ask your health care provider.  If you are sexually active, practice safe sex. Use a condom or other form of protection to prevent STIs (sexually transmitted infections).  Talk with your health care provider about taking a low-dose aspirin every day starting at age 23. What's next?  Go to your health  care provider once a year for a well check visit.  Ask your health care provider how often you should have your eyes and teeth checked.  Stay up to date on all vaccines. This information is not intended to replace advice given to you by your health care provider. Make sure you discuss any questions you have with your health care provider. Document Released: 12/30/2015 Document Revised: 11/27/2018 Document Reviewed: 11/27/2018 Elsevier Patient Education  2020 [place], MD [facility]

## 2019-11-09 ENCOUNTER — Telehealth: Payer: Self-pay | Admitting: Internal Medicine

## 2019-11-09 ENCOUNTER — Telehealth: Payer: Self-pay

## 2019-11-09 NOTE — Telephone Encounter (Signed)
Yes, no problem to take those together. There are no drug interactions.

## 2019-11-09 NOTE — Telephone Encounter (Signed)
Thanks [name]. Patient made aware.  Edward Hendricks

## 2019-11-09 NOTE — Telephone Encounter (Signed)
Pt called stating he did speak with nurse of Dr. Johnnye Sima and she stated that it would be fine for him to take the Lipitor without any drug interactions. See telephone encounter. Please advise.    [id number] - [place], [place] [address] [place] [address]  Phone: [phone] Fax: [phone]  Not a 24 hour pharmacy; exact hours not known.

## 2019-11-09 NOTE — Telephone Encounter (Signed)
Patient called stating he has a new PCP and she wants to start him on Lipitor 40mg  daily. Patient asking if this is ok to take with his Odefsey. Routing to Pharmacy and provider for advise.  Eugenia Mcalpine

## 2019-11-11 ENCOUNTER — Other Ambulatory Visit: Payer: Self-pay | Admitting: Internal Medicine

## 2019-11-11 DIAGNOSIS — E781 Pure hyperglyceridemia: Secondary | ICD-10-CM

## 2019-11-11 MED ORDER — ATORVASTATIN CALCIUM 40 MG PO TABS: 40.0000 mg | ORAL_TABLET | Freq: Every day | ORAL | 1 refills | Status: DC

## 2019-11-11 NOTE — Telephone Encounter (Signed)
Called patient. LVM that Rx was sent.

## 2019-11-11 NOTE — Telephone Encounter (Signed)
Lipitor Rx has been sent.

## 2019-11-16 NOTE — Telephone Encounter (Signed)
Thanks everybody

## 2019-11-19 ENCOUNTER — Telehealth: Payer: Self-pay | Admitting: Internal Medicine

## 2019-11-19 NOTE — Telephone Encounter (Signed)
[facility] Dept received 7 pages of medical records from [facility].  Sending interoffice mail to GI office. 11/19/19  KLM

## 2019-11-24 ENCOUNTER — Telehealth: Payer: Self-pay | Admitting: Internal Medicine

## 2019-12-01 ENCOUNTER — Other Ambulatory Visit: Payer: Self-pay | Admitting: Infectious Diseases

## 2019-12-01 DIAGNOSIS — B20 Human immunodeficiency virus [HIV] disease: Secondary | ICD-10-CM

## 2019-12-03 ENCOUNTER — Encounter: Payer: Self-pay | Admitting: Internal Medicine

## 2019-12-03 DIAGNOSIS — K219 Gastro-esophageal reflux disease without esophagitis: Secondary | ICD-10-CM | POA: Insufficient documentation

## 2019-12-03 NOTE — Telephone Encounter (Signed)
Records from [facility] reviewed and old [facility] records reviewed  He needs to set up a direct colonoscopy (Hx polyps) AND direct EGD (same day)  I need to repeat the EGd to see if esophagus is healed from last year  Please ask and make sure he is taking his pantoprazole daily - if not let me know as we want him to take that prior to [place] I want to make sure he is not having a lot of acid reflux sxs - so if he does not think reflux controlled change to an office visit with me or an APP

## 2019-12-07 ENCOUNTER — Encounter: Payer: Self-pay | Admitting: Internal Medicine

## 2019-12-08 ENCOUNTER — Encounter: Payer: Self-pay | Admitting: Internal Medicine

## 2019-12-16 ENCOUNTER — Telehealth: Payer: Self-pay | Admitting: *Deleted

## 2019-12-16 NOTE — Telephone Encounter (Signed)
PA submitted 12/30 for odefsey to [email or web address] ([place] ID [id number]). Landis Gandy, RN

## 2019-12-16 NOTE — Telephone Encounter (Signed)
Pt is scheduled for 12/24/2019 with Dr. Carlean Purl

## 2019-12-17 ENCOUNTER — Ambulatory Visit (AMBULATORY_SURGERY_CENTER): Payer: PRIVATE HEALTH INSURANCE

## 2019-12-17 ENCOUNTER — Other Ambulatory Visit: Payer: Self-pay

## 2019-12-17 ENCOUNTER — Encounter: Payer: Self-pay | Admitting: Internal Medicine

## 2019-12-17 VITALS — Temp 97.8°F | Ht 69.0 in | Wt 269.6 lb

## 2019-12-17 DIAGNOSIS — Z1159 Encounter for screening for other viral diseases: Secondary | ICD-10-CM

## 2019-12-17 DIAGNOSIS — Z86010 Personal history of colonic polyps: Secondary | ICD-10-CM

## 2019-12-17 DIAGNOSIS — K2270 Barrett's esophagus without dysplasia: Secondary | ICD-10-CM

## 2019-12-17 DIAGNOSIS — K269 Duodenal ulcer, unspecified as acute or chronic, without hemorrhage or perforation: Secondary | ICD-10-CM

## 2019-12-17 MED ORDER — NA SULFATE-K SULFATE-MG SULF 17.5-3.13-1.6 GM/177ML PO SOLN: 1.0000 | Freq: Once | ORAL | 0 refills | Status: AC

## 2019-12-17 NOTE — Telephone Encounter (Signed)
Approved. Notified [facility] and [facility].

## 2019-12-17 NOTE — Progress Notes (Signed)
No egg or soy allergy known to patient  No issues with past sedation with any surgeries  or procedures, no intubation problems  No diet pills per patient No home 02 use per patient  No blood thinners per patient  Pt denies issues with chronic constipation but states occasionally he is constipated, instructed pt to take miralax one scoop daily for 5 days prior to procedure. And to really push fluids the day before the procedure to help the prep to work better.  No A fib or A flutter  EMMI video sent to pt's e mail  suprep coupon given.  Due to the COVID-19 pandemic we are asking patients to follow these guidelines. Please only bring one care partner. Please be aware that your care partner may wait in the car in the parking lot or if they feel like they will be too hot to wait in the car, they may wait in the lobby on the 4th floor. All care partners are required to wear a mask the entire time (we do not have any that we can provide them), they need to practice social distancing, and we will do a Covid check for all patient's and care partners when you arrive. Also we will check their temperature and your temperature. If the care partner waits in their car they need to stay in the parking lot the entire time and we will call them on their cell phone when the patient is ready for discharge so they can bring the car to the front of the building. Also all patient's will need to wear a mask into building.

## 2019-12-21 ENCOUNTER — Ambulatory Visit (INDEPENDENT_AMBULATORY_CARE_PROVIDER_SITE_OTHER): Payer: PRIVATE HEALTH INSURANCE

## 2019-12-21 DIAGNOSIS — Z1159 Encounter for screening for other viral diseases: Secondary | ICD-10-CM

## 2019-12-22 LAB — SARS CORONAVIRUS 2 (TAT 6-24 HRS): SARS Coronavirus 2: NEGATIVE

## 2019-12-24 ENCOUNTER — Encounter: Payer: Self-pay | Admitting: Internal Medicine

## 2019-12-24 ENCOUNTER — Ambulatory Visit (AMBULATORY_SURGERY_CENTER): Payer: PRIVATE HEALTH INSURANCE | Admitting: Internal Medicine

## 2019-12-24 ENCOUNTER — Other Ambulatory Visit: Payer: Self-pay

## 2019-12-24 VITALS — BP 103/53 | HR 91 | Temp 98.8°F | Resp 23 | Ht 69.0 in | Wt 269.0 lb

## 2019-12-24 DIAGNOSIS — D120 Benign neoplasm of cecum: Secondary | ICD-10-CM

## 2019-12-24 DIAGNOSIS — Z86010 Personal history of colonic polyps: Secondary | ICD-10-CM | POA: Diagnosis present

## 2019-12-24 DIAGNOSIS — K2270 Barrett's esophagus without dysplasia: Secondary | ICD-10-CM

## 2019-12-24 DIAGNOSIS — D122 Benign neoplasm of ascending colon: Secondary | ICD-10-CM

## 2019-12-24 DIAGNOSIS — D123 Benign neoplasm of transverse colon: Secondary | ICD-10-CM | POA: Diagnosis not present

## 2019-12-24 MED ORDER — SODIUM CHLORIDE 0.9 % IV SOLN: 500.0000 mL | Freq: Once | INTRAVENOUS | Status: DC

## 2019-12-24 NOTE — Patient Instructions (Addendum)
I saw and took biopsies of the barrett's esophagus. Nothing suspicious there but we routinely sample it to check it.  I found and removed 3 tiny colon polyps that look benign but possibly pre-cancerous.  I will let you know pathology results and when to have another routine upper endoscopy and colonoscopy by mail and/or My Chart.  I appreciate the opportunity to care for you. Gatha Mayer, MD, [facility]   Handouts given on polyps and diverticulosis.  YOU HAD AN ENDOSCOPIC PROCEDURE TODAY AT [facility]:   Refer to the procedure report that was given to you for any specific questions about what was found during the examination.  If the procedure report does not answer your questions, please call your gastroenterologist to clarify.  If you requested that your care partner not be given the details of your procedure findings, then the procedure report has been included in a sealed envelope for you to review at your convenience later.  YOU SHOULD EXPECT: Some feelings of bloating in the abdomen. Passage of more gas than usual.  Walking can help get rid of the air that was put into your GI tract during the procedure and reduce the bloating. If you had a lower endoscopy (such as a colonoscopy or flexible sigmoidoscopy) you may notice spotting of blood in your stool or on the toilet paper. If you underwent a bowel prep for your procedure, you may not have a normal bowel movement for a few days.  Please Note:  You might notice some irritation and congestion in your nose or some drainage.  This is from the oxygen used during your procedure.  There is no need for concern and it should clear up in a day or so.  SYMPTOMS TO REPORT IMMEDIATELY:   Following lower endoscopy (colonoscopy or flexible sigmoidoscopy):  Excessive amounts of blood in the stool  Significant tenderness or worsening of abdominal pains  Swelling of the abdomen that is new, acute  Fever of 100F or higher   Following  upper endoscopy (EGD)  Vomiting of blood or coffee ground material  New chest pain or pain under the shoulder blades  Painful or persistently difficult swallowing  New shortness of breath  Fever of 100F or higher  Black, tarry-looking stools  For urgent or emergent issues, a gastroenterologist can be reached at any hour by calling [phone].   DIET:  We do recommend a small meal at first, but then you may proceed to your regular diet.  Drink plenty of fluids but you should avoid alcoholic beverages for 24 hours.  ACTIVITY:  You should plan to take it easy for the rest of today and you should NOT DRIVE or use heavy machinery until tomorrow (because of the sedation medicines used during the test).    FOLLOW UP: Our staff will call the number listed on your records 48-72 hours following your procedure to check on you and address any questions or concerns that you may have regarding the information given to you following your procedure. If we do not reach you, we will leave a message.  We will attempt to reach you two times.  During this call, we will ask if you have developed any symptoms of COVID 19. If you develop any symptoms (ie: fever, flu-like symptoms, shortness of breath, cough etc.) before then, please call [phone].  If you test positive for Covid 19 in the 2 weeks post procedure, please call and report this information to [place].  If any biopsies were taken you will be contacted by phone or by letter within the next 1-3 weeks.  Please call us at [phone] if you have not heard about the biopsies in 3 weeks.    SIGNATURES/CONFIDENTIALITY: You and/or your care partner have signed paperwork which will be entered into your electronic medical record.  These signatures attest to the fact that that the information above on your After Visit Summary has been reviewed and is understood.  Full responsibility of the confidentiality of this discharge information lies with you and/or  your care-partner.

## 2019-12-24 NOTE — Op Note (Signed)
[place] Patient Name: Edward Hendricks Procedure Date: 12/24/2019 1:24 PM MRN: [id number] Endoscopist: Gatha Mayer , MD Age: 59 Referring MD:  Date of Birth: [birth year] Gender: Male Account #: [id number] Procedure:                Colonoscopy Indications:              Surveillance: Personal history of adenomatous                            polyps on last colonoscopy > 5 years ago Medicines:                Propofol per Anesthesia, Monitored Anesthesia Care Procedure:                Pre-Anesthesia Assessment:                           - Prior to the procedure, a History and Physical                            was performed, and patient medications and                            allergies were reviewed. The patient's tolerance of                            previous anesthesia was also reviewed. The risks                            and benefits of the procedure and the sedation                            options and risks were discussed with the patient.                            All questions were answered, and informed consent                            was obtained. Prior Anticoagulants: The patient has                            taken no previous anticoagulant or antiplatelet                            agents. ASA Grade Assessment: II - A patient with                            mild systemic disease. After reviewing the risks                            and benefits, the patient was deemed in                            satisfactory condition to undergo the procedure.  After obtaining informed consent, the colonoscope                            was passed under direct vision. Throughout the                            procedure, the patient's blood pressure, pulse, and                            oxygen saturations were monitored continuously. The                            Colonoscope was introduced through the anus and                             advanced to the the cecum, identified by                            appendiceal orifice and ileocecal valve. The                            colonoscopy was performed without difficulty. The                            patient tolerated the procedure well. The quality                            of the bowel preparation was good. The bowel                            preparation used was Miralax via split dose                            instruction. The ileocecal valve, appendiceal                            orifice, and rectum were photographed. Scope In: 1:40:27 PM Scope Out: 1:54:19 PM Scope Withdrawal Time: 0 hours 12 minutes 8 seconds  Total Procedure Duration: 0 hours 13 minutes 52 seconds  Findings:                 The perianal and digital rectal examinations were                            normal. Pertinent negatives include normal prostate                            (size, shape, and consistency).                           Three sessile polyps were found in the transverse                            colon, ascending colon and cecum. The polyps were 1  to 2 mm in size. These polyps were removed with a                            cold biopsy forceps. Resection and retrieval were                            complete. Verification of patient identification                            for the specimen was done. Estimated blood loss was                            minimal.                           Multiple diverticula were found in the sigmoid                            colon.                           The exam was otherwise without abnormality on                            direct and retroflexion views. Complications:            No immediate complications. Estimated Blood Loss:     Estimated blood loss was minimal. Impression:               - Three 1 to 2 mm polyps in the transverse colon,                            in the ascending colon and in the cecum, removed                             with a cold biopsy forceps. Resected and retrieved.                           - Diverticulosis in the sigmoid colon.                           - The examination was otherwise normal on direct                            and retroflexion views.                           - Personal history of colonic polyps. 4 diminutive                            adenomas 2014 Recommendation:           - Patient has a contact number available for                            emergencies. The signs and  symptoms of potential                            delayed complications were discussed with the                            patient. Return to normal activities tomorrow.                            Written discharge instructions were provided to the                            patient.                           - Resume previous diet.                           - Continue present medications.                           - Repeat colonoscopy is recommended for                            surveillance. The colonoscopy date will be                            determined after pathology results from today's                            exam become available for review. Gatha Mayer, MD 12/24/2019 2:10:18 PM This report has been signed electronically.

## 2019-12-24 NOTE — Progress Notes (Signed)
Called to room to assist during endoscopic procedure.  Patient ID and intended procedure confirmed with present staff. Received instructions for my participation in the procedure from the performing physician.  

## 2019-12-24 NOTE — Progress Notes (Signed)
Pt had difficulty passing air after his procedure. RN attempted repositioning pt from left lateral side to right lateral side within the first 10 minutes in recovery and gave 2 Levsin tablets with no relief in cramping and he was still unable to pass air. Dr. Carlean Purl at bedside and he placed a rectal tube. A small amount of air came out on insertion. Rectal tube left in and RN continued to have patient reposition. Attempted prone position but pt was not able to tolerate. Pt returned to left lateral side. Pt began to be able to pass more air at this time and reported he was feeling relief in cramping. Pt also offered hot tea and that also helped with relief. At the time of discharge, pt's abdomin was still distended but was much softer than it was initially. Pt reported relief of cramping and he was stable for discharge.

## 2019-12-24 NOTE — Progress Notes (Signed)
Report to PACU, RN, vss, BBS= Clear.  

## 2019-12-24 NOTE — Op Note (Signed)
[place] Patient Name: Edward Hendricks Procedure Date: 12/24/2019 1:25 PM MRN: [id number] Endoscopist: Gatha Mayer , MD Age: 59 Referring MD:  Date of Birth: [birth year] Gender: Male Account #: [id number] Procedure:                Upper GI endoscopy Indications:              Follow-up of Barrett's esophagus Medicines:                Propofol per Anesthesia, Monitored Anesthesia Care Procedure:                Pre-Anesthesia Assessment:                           - Prior to the procedure, a History and Physical                            was performed, and patient medications and                            allergies were reviewed. The patient's tolerance of                            previous anesthesia was also reviewed. The risks                            and benefits of the procedure and the sedation                            options and risks were discussed with the patient.                            All questions were answered, and informed consent                            was obtained. Prior Anticoagulants: The patient has                            taken no previous anticoagulant or antiplatelet                            agents. ASA Grade Assessment: II - A patient with                            mild systemic disease. After reviewing the risks                            and benefits, the patient was deemed in                            satisfactory condition to undergo the procedure.                           After obtaining informed consent, the endoscope was  passed under direct vision. Throughout the                            procedure, the patient's blood pressure, pulse, and                            oxygen saturations were monitored continuously. The                            Endoscope was introduced through the mouth, and                            advanced to the second part of duodenum. The upper   GI endoscopy was accomplished without difficulty.                            The patient tolerated the procedure well. Scope In: Scope Out: Findings:                 There were esophageal mucosal changes secondary to                            established long-segment Barrett's disease,                            classified as Barrett's stage C0-M4 per [place]                            criteria present in the lower third of the                            esophagus. The maximum longitudinal extent of these                            mucosal changes was 5 cm in length. Mucosa was                            biopsied with a cold forceps for histology in 4                            quadrants at intervals of 2 cm from 35 to 40 cm                            from the incisors. A total of 3 specimen bottles                            were sent to pathology. Verification of patient                            identification for the specimen was done. Estimated                            blood loss was minimal.  The exam was otherwise without abnormality.                           The cardia and gastric fundus were normal on                            retroflexion. Complications:            No immediate complications. Estimated Blood Loss:     Estimated blood loss was minimal. Impression:               - Esophageal mucosal changes secondary to                            established long-segment Barrett's disease,                            classified as Barrett's stage C0-M4 per [place]                            criteria. Biopsied.                           - The examination was otherwise normal. Recommendation:           - Patient has a contact number available for                            emergencies. The signs and symptoms of potential                            delayed complications were discussed with the                            patient. Return to normal  activities tomorrow.                            Written discharge instructions were provided to the                            patient.                           - Resume previous diet.                           - Continue present medications.                           - See the other procedure note for documentation of                            additional recommendations. Gatha Mayer, MD 12/24/2019 [id number] PM This report has been signed electronically.

## 2019-12-28 ENCOUNTER — Telehealth: Payer: Self-pay | Admitting: *Deleted

## 2019-12-28 ENCOUNTER — Telehealth: Payer: Self-pay

## 2019-12-28 NOTE — Telephone Encounter (Signed)
Attempted to call patient. Unable to leave message, no voicemail available.

## 2019-12-28 NOTE — Telephone Encounter (Signed)
1. Have you developed a fever since your procedure? no  2.   Have you had an respiratory symptoms (SOB or cough) since your procedure? no  3.   Have you tested positive for COVID 19 since your procedure no  4.   Have you had any family members/close contacts diagnosed with the COVID 19 since your procedure?  no   If yes to any of these questions please route to Joylene John, RN and Alphonsa Gin, Therapist, sports.  Follow up Call-  Call back number 12/24/2019  Post procedure Call Back phone  # cell [phone]  Permission to leave phone message Yes  Some recent data might be hidden     Patient questions:  Do you have a fever, pain , or abdominal swelling? No. Pain Score  0 *  Have you tolerated food without any problems? Yes.    Have you been able to return to your normal activities? Yes.    Do you have any questions about your discharge instructions: Diet   No. Medications  No. Follow up visit  No.  Do you have questions or concerns about your Care? No.  Actions: * If pain score is 4 or above: No action needed, pain <4.

## 2020-01-01 ENCOUNTER — Encounter: Payer: Self-pay | Admitting: Internal Medicine

## 2020-01-01 DIAGNOSIS — Z86010 Personal history of colonic polyps: Secondary | ICD-10-CM

## 2020-01-01 DIAGNOSIS — K2270 Barrett's esophagus without dysplasia: Secondary | ICD-10-CM | POA: Insufficient documentation

## 2020-03-07 IMAGING — CR DG LUMBAR SPINE 2-3V
1 series · 1 of 1 positions shown · non-contrast
Comparison: Lumbar spine films of 12/02/2018

CLINICAL DATA: Localization images in the operating room

EXAM:
LUMBAR SPINE - 2-3 VIEW

[lateral]
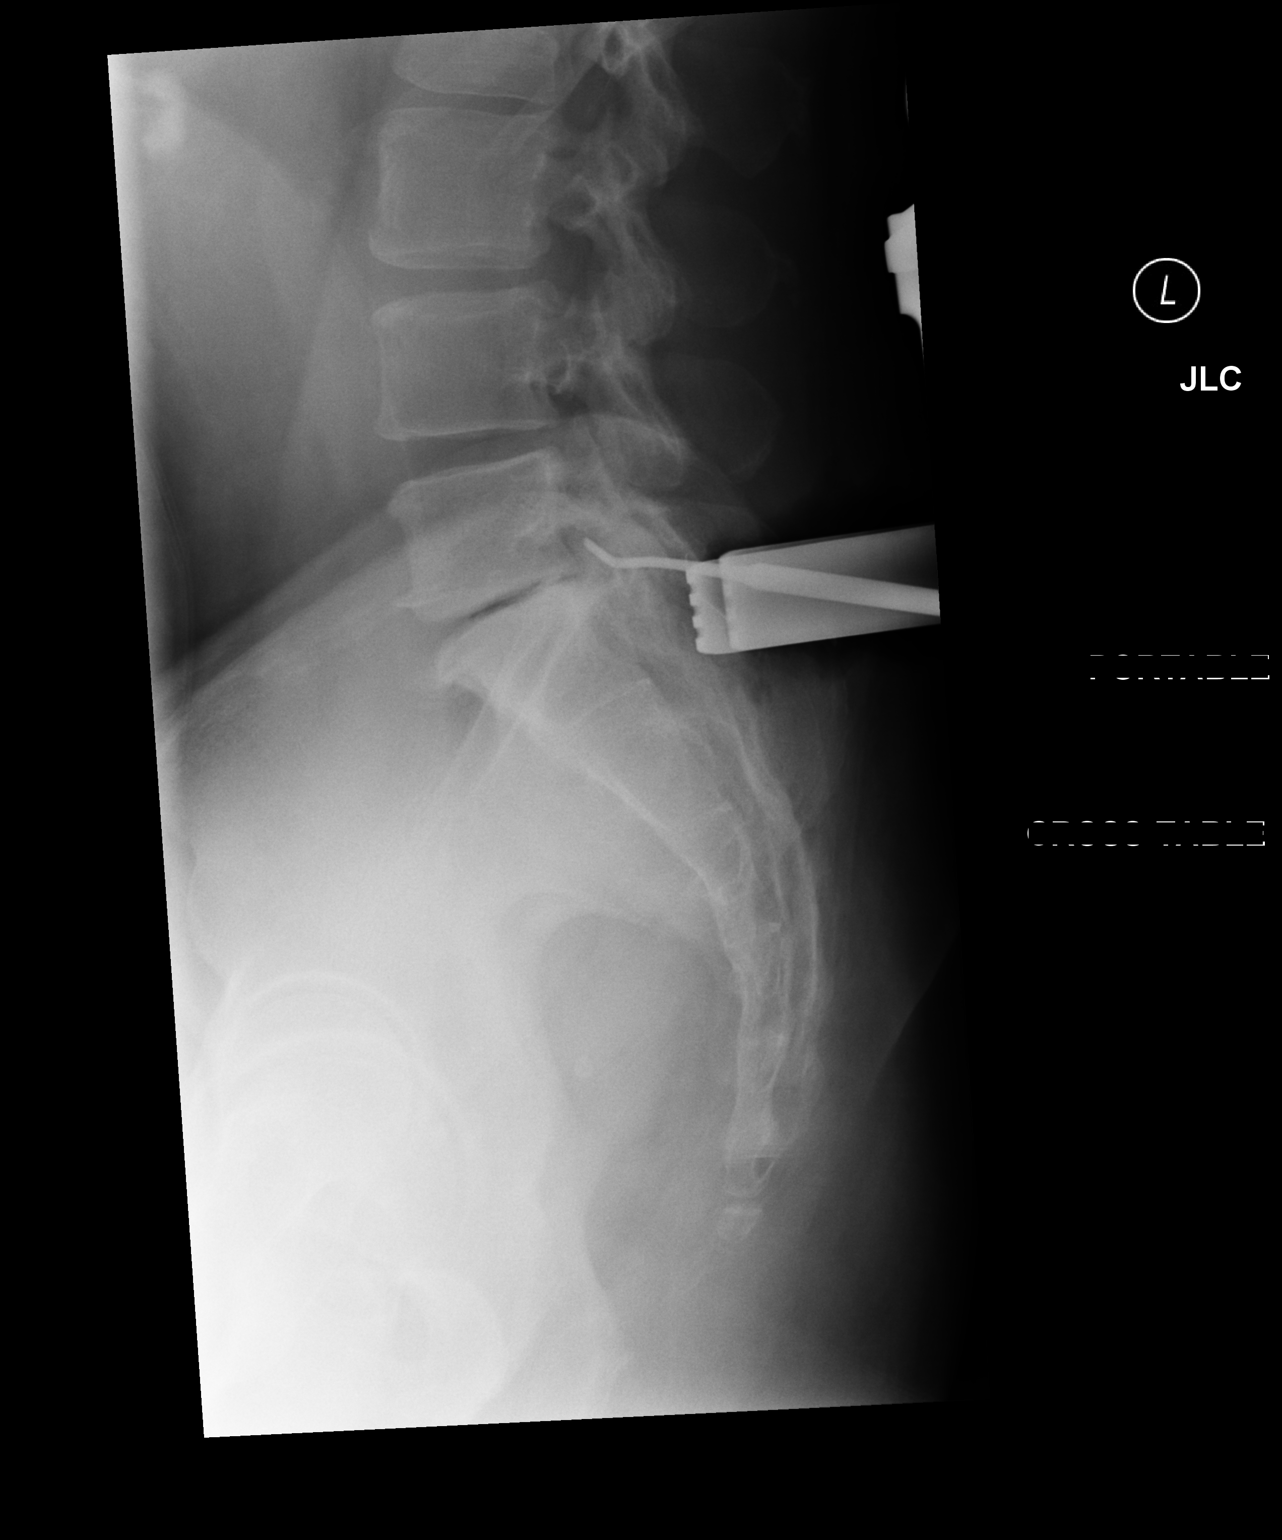

[1 of 1 positions shown; findings below may reference images not displayed]

FINDINGS: On the initial cross-table lateral view, needles are positioned
posteriorly directed toward the spinous processes of L4 and L5. On
the second film the instrument is directed toward the L5-S1
interspace. On the third film and instrument is directed toward the
L5-S1 interspace.
IMPRESSION: Localization of L5-S1 interspace on the last cross-table lateral
view.

## 2020-04-13 ENCOUNTER — Other Ambulatory Visit: Payer: Self-pay | Admitting: Infectious Diseases

## 2020-04-13 DIAGNOSIS — I10 Essential (primary) hypertension: Secondary | ICD-10-CM

## 2020-05-06 ENCOUNTER — Other Ambulatory Visit: Payer: Self-pay | Admitting: Internal Medicine

## 2020-05-06 DIAGNOSIS — E781 Pure hyperglyceridemia: Secondary | ICD-10-CM

## 2020-06-21 ENCOUNTER — Other Ambulatory Visit (HOSPITAL_COMMUNITY)
Admission: RE | Admit: 2020-06-21 | Discharge: 2020-06-21 | Disposition: A | Discharge status: Home / Self Care | Payer: PRIVATE HEALTH INSURANCE | Source: Ambulatory Visit | Attending: Infectious Diseases | Admitting: Infectious Diseases

## 2020-06-21 ENCOUNTER — Other Ambulatory Visit: Payer: PRIVATE HEALTH INSURANCE

## 2020-06-21 ENCOUNTER — Other Ambulatory Visit: Payer: Self-pay

## 2020-06-21 DIAGNOSIS — Z113 Encounter for screening for infections with a predominantly sexual mode of transmission: Secondary | ICD-10-CM | POA: Diagnosis not present

## 2020-06-21 DIAGNOSIS — Z79899 Other long term (current) drug therapy: Secondary | ICD-10-CM

## 2020-06-21 DIAGNOSIS — B20 Human immunodeficiency virus [HIV] disease: Secondary | ICD-10-CM

## 2020-06-22 LAB — T-HELPER CELL (CD4) - (RCID CLINIC ONLY)
CD4 % Helper T Cell: 35 % (ref 33–65)
CD4 T Cell Abs: 750 /uL (ref 400–1790)

## 2020-06-22 LAB — URINE CYTOLOGY ANCILLARY ONLY
Chlamydia: NEGATIVE
Comment: NEGATIVE
Comment: NORMAL
Neisseria Gonorrhea: NEGATIVE

## 2020-06-28 LAB — CBC
HCT: 44 % (ref 38.5–50.0)
Hemoglobin: 15.3 g/dL (ref 13.2–17.1)
MCH: 31.5 pg (ref 27.0–33.0)
MCHC: 34.8 g/dL (ref 32.0–36.0)
MCV: 90.5 fL (ref 80.0–100.0)
MPV: 11 fL (ref 7.5–12.5)
Platelets: 187 10*3/uL (ref 140–400)
RBC: 4.86 10*6/uL (ref 4.20–5.80)
RDW: 12.8 % (ref 11.0–15.0)
WBC: 5.9 10*3/uL (ref 3.8–10.8)

## 2020-06-28 LAB — HIV-1 RNA QUANT-NO REFLEX-BLD
HIV 1 RNA Quant: <20 copies/mL
HIV-1 RNA Quant, Log: <1.3 Log copies/mL

## 2020-06-28 LAB — RPR TITER: RPR Titer: 1:1 {titer} — ABNORMAL HIGH

## 2020-06-28 LAB — COMPREHENSIVE METABOLIC PANEL
AG Ratio: 2.1 (calc) (ref 1.0–2.5)
ALT: 53 U/L — ABNORMAL HIGH (ref 9–46)
AST: 28 U/L (ref 10–35)
Albumin: 4.6 g/dL (ref 3.6–5.1)
Alkaline phosphatase (APISO): 113 U/L (ref 35–144)
BUN: 10 mg/dL (ref 7–25)
CO2: 24 mmol/L (ref 20–32)
Calcium: 8.9 mg/dL (ref 8.6–10.3)
Chloride: 104 mmol/L (ref 98–110)
Creat: 0.91 mg/dL (ref 0.70–1.33)
Globulin: 2.2 g/dL (calc) (ref 1.9–3.7)
Glucose, Bld: 98 mg/dL (ref 65–99)
Potassium: 3.8 mmol/L (ref 3.5–5.3)
Sodium: 138 mmol/L (ref 135–146)
Total Bilirubin: 0.7 mg/dL (ref 0.2–1.2)
Total Protein: 6.8 g/dL (ref 6.1–8.1)

## 2020-06-28 LAB — LIPID PANEL
Cholesterol: 142 mg/dL (ref <200)
HDL: 33 mg/dL — ABNORMAL LOW (ref >=40)
LDL Cholesterol (Calc): 88 mg/dL (calc)
Non-HDL Cholesterol (Calc): 109 mg/dL (calc) (ref <130)
Total CHOL/HDL Ratio: 4.3 (calc) (ref <5.0)
Triglycerides: 113 mg/dL (ref <150)

## 2020-06-28 LAB — FLUORESCENT TREPONEMAL AB(FTA)-IGG-BLD: Fluorescent Treponemal ABS: NONREACTIVE

## 2020-06-28 LAB — RPR: RPR Ser Ql: REACTIVE — AB

## 2020-07-07 ENCOUNTER — Ambulatory Visit (INDEPENDENT_AMBULATORY_CARE_PROVIDER_SITE_OTHER): Payer: PRIVATE HEALTH INSURANCE | Admitting: Infectious Diseases

## 2020-07-07 ENCOUNTER — Encounter: Payer: Self-pay | Admitting: Infectious Diseases

## 2020-07-07 ENCOUNTER — Other Ambulatory Visit: Payer: Self-pay

## 2020-07-07 VITALS — BP 133/86 | HR 90 | Wt 281.8 lb

## 2020-07-07 DIAGNOSIS — Z113 Encounter for screening for infections with a predominantly sexual mode of transmission: Secondary | ICD-10-CM | POA: Diagnosis not present

## 2020-07-07 DIAGNOSIS — I10 Essential (primary) hypertension: Secondary | ICD-10-CM

## 2020-07-07 DIAGNOSIS — E669 Obesity, unspecified: Secondary | ICD-10-CM

## 2020-07-07 DIAGNOSIS — B20 Human immunodeficiency virus [HIV] disease: Secondary | ICD-10-CM | POA: Diagnosis not present

## 2020-07-07 DIAGNOSIS — K2270 Barrett's esophagus without dysplasia: Secondary | ICD-10-CM | POA: Diagnosis not present

## 2020-07-07 DIAGNOSIS — Z79899 Other long term (current) drug therapy: Secondary | ICD-10-CM

## 2020-07-07 MED ORDER — PANTOPRAZOLE SODIUM 40 MG PO TBEC: 40.0000 mg | DELAYED_RELEASE_TABLET | ORAL | 3 refills | Status: DC

## 2020-07-07 NOTE — Progress Notes (Signed)
   Subjective:    Patient ID: Edward Hendricks, male    DOB: 02-06-1961, 59 y.o.   MRN: [id number]  HPI 59yo M with hx of HIV+ and HTN. On norvasc, atripla --->odefsy.  Had L5-S1 micro-decompression on 12-04-18.   Has been taking maalox for his GI upset, constipation. Having cramping and gas as well.    Spacing odefsey 12h from his GERD rx.  Had 2 EGD last year,  Had colon 2021. 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 Gets repeated yearly.   Abd feels better. Has some occas RUQ discomfort- "inflamed, mild burn sensation".   Eating more vegetables, more chicken/fish.  Still having back pain.  Not able to exercise as well. Has treadmill, uses 1 hour/day.   HIV 1 RNA Quant (copies/mL)  Date Value  06/21/2020 <20 NOT DETECTED  09/28/2019 <20 NOT DETECTED  12/03/2018 40 (H)   CD4 T Cell Abs (/uL)  Date Value  06/21/2020 750  09/28/2019 720  12/03/2018 600   Lab Results  Component Value Date   CHOL 142 06/21/2020   HDL 33 (L) 06/21/2020   LDLCALC 88 06/21/2020   TRIG 113 06/21/2020   CHOLHDL 4.3 06/21/2020     Review of Systems  Constitutional: Negative for appetite change and unexpected weight change.  Gastrointestinal: Positive for abdominal pain. Negative for constipation and diarrhea.  Musculoskeletal: Positive for back pain.  Please see HPI. All other systems reviewed and negative.      Objective:   Physical Exam Vitals reviewed.  Constitutional:      Appearance: He is obese.  HENT:     Mouth/Throat:     Mouth: Mucous membranes are moist.     Pharynx: No oropharyngeal exudate.  Eyes:     Extraocular Movements: Extraocular movements intact.     Pupils: Pupils are equal, round, and reactive to light.  Cardiovascular:     Rate and Rhythm: Normal rate.  Pulmonary:     Effort: Pulmonary effort is normal.     Breath sounds: Normal breath sounds.  Abdominal:     General: Bowel sounds are normal. There is no distension.     Palpations: Abdomen is soft.      Tenderness: There is no abdominal tenderness.  Musculoskeletal:     Cervical back: Normal range of motion.     Right lower leg: Edema present.     Left lower leg: Edema present.  Neurological:     General: No focal deficit present.           Assessment & Plan:

## 2020-07-07 NOTE — Assessment & Plan Note (Addendum)
Doing well, well controlled on CCB.  appreciate PCP f/u.

## 2020-07-07 NOTE — Assessment & Plan Note (Signed)
Appreciate Dr Celesta Aver f/u.

## 2020-07-07 NOTE — Assessment & Plan Note (Signed)
We discussed bariatric clinic (surgical and medical) He defers.

## 2020-07-07 NOTE — Assessment & Plan Note (Addendum)
He is doing well Has gotten COVID vax Married (partner negative, can't remember last test). Would rec test yearly.  No change in odefsy.  vax up to date, he has gotten 2 shingles vax.  rtc in 9 months with labs prior.

## 2020-07-18 ENCOUNTER — Other Ambulatory Visit: Payer: Self-pay | Admitting: Infectious Diseases

## 2020-07-18 DIAGNOSIS — I10 Essential (primary) hypertension: Secondary | ICD-10-CM

## 2020-10-11 ENCOUNTER — Other Ambulatory Visit: Payer: Self-pay | Admitting: Infectious Diseases

## 2020-10-11 DIAGNOSIS — I10 Essential (primary) hypertension: Secondary | ICD-10-CM

## 2020-11-01 ENCOUNTER — Other Ambulatory Visit: Payer: Self-pay | Admitting: Internal Medicine

## 2020-11-01 DIAGNOSIS — E781 Pure hyperglyceridemia: Secondary | ICD-10-CM

## 2020-12-24 ENCOUNTER — Other Ambulatory Visit: Payer: Self-pay | Admitting: Infectious Diseases

## 2020-12-24 DIAGNOSIS — B20 Human immunodeficiency virus [HIV] disease: Secondary | ICD-10-CM

## 2020-12-26 ENCOUNTER — Telehealth: Payer: Self-pay | Admitting: *Deleted

## 2020-12-26 ENCOUNTER — Other Ambulatory Visit: Payer: Self-pay | Admitting: Infectious Diseases

## 2020-12-26 DIAGNOSIS — B20 Human immunodeficiency virus [HIV] disease: Secondary | ICD-10-CM

## 2020-12-26 NOTE — Telephone Encounter (Signed)
PA submitted via [employer] by Eugenia Mcalpine. Please note EOC ID: [id number] when checking for updates.

## 2020-12-26 NOTE — Telephone Encounter (Signed)
PA submitted via [employer] Please note EOC ID: [id number] when checking for updates

## 2020-12-27 NOTE — Telephone Encounter (Signed)
Drug/Service Name: Clydene Pugh  EOC ID: [id number]  Status: In Progress  Will continue to follow. Eugenia Mcalpine

## 2020-12-27 NOTE — Telephone Encounter (Signed)
Patient called inquiring about status of Odefsey prescription. RN advised patient that PA was submitted yesterday.   Beryle Flock, RN

## 2020-12-29 NOTE — Telephone Encounter (Signed)
Thanks

## 2020-12-29 NOTE — Telephone Encounter (Addendum)
Patient PA for Edward Hendricks has now been approved 12/28/20-12/27/21. EOC# [id number] Approval has been faxed to [place]

## 2020-12-29 NOTE — Telephone Encounter (Signed)
Received notice that patient's PA for [facility] was denied. Will notify provider.   Beryle Flock, RN

## 2021-01-04 ENCOUNTER — Other Ambulatory Visit: Payer: Self-pay | Admitting: Infectious Diseases

## 2021-01-04 DIAGNOSIS — B20 Human immunodeficiency virus [HIV] disease: Secondary | ICD-10-CM

## 2021-01-11 ENCOUNTER — Other Ambulatory Visit: Payer: Self-pay | Admitting: Infectious Diseases

## 2021-01-11 DIAGNOSIS — I10 Essential (primary) hypertension: Secondary | ICD-10-CM

## 2021-01-31 ENCOUNTER — Other Ambulatory Visit: Payer: Self-pay | Admitting: Internal Medicine

## 2021-01-31 DIAGNOSIS — E781 Pure hyperglyceridemia: Secondary | ICD-10-CM

## 2021-02-12 ENCOUNTER — Other Ambulatory Visit: Payer: Self-pay | Admitting: Internal Medicine

## 2021-02-12 DIAGNOSIS — E781 Pure hyperglyceridemia: Secondary | ICD-10-CM

## 2021-02-14 MED ORDER — ATORVASTATIN CALCIUM 40 MG PO TABS: 40.0000 mg | ORAL_TABLET | Freq: Every day | ORAL | 0 refills | Status: DC

## 2021-03-23 ENCOUNTER — Other Ambulatory Visit: Payer: PRIVATE HEALTH INSURANCE

## 2021-03-23 ENCOUNTER — Other Ambulatory Visit: Payer: Self-pay | Admitting: Infectious Diseases

## 2021-03-23 ENCOUNTER — Other Ambulatory Visit: Payer: Self-pay

## 2021-03-23 DIAGNOSIS — B20 Human immunodeficiency virus [HIV] disease: Secondary | ICD-10-CM

## 2021-03-23 DIAGNOSIS — Z113 Encounter for screening for infections with a predominantly sexual mode of transmission: Secondary | ICD-10-CM

## 2021-03-23 DIAGNOSIS — Z79899 Other long term (current) drug therapy: Secondary | ICD-10-CM

## 2021-03-24 LAB — URINE CYTOLOGY ANCILLARY ONLY
Chlamydia: NEGATIVE
Comment: NEGATIVE
Comment: NORMAL
Neisseria Gonorrhea: NEGATIVE

## 2021-03-24 LAB — T-HELPER CELL (CD4) - (RCID CLINIC ONLY)
CD4 % Helper T Cell: 36 % (ref 33–65)
CD4 T Cell Abs: 701 /uL (ref 400–1790)

## 2021-03-26 LAB — CBC
HCT: 46.8 % (ref 38.5–50.0)
Hemoglobin: 16 g/dL (ref 13.2–17.1)
MCH: 31.1 pg (ref 27.0–33.0)
MCHC: 34.2 g/dL (ref 32.0–36.0)
MCV: 91.1 fL (ref 80.0–100.0)
MPV: 10.5 fL (ref 7.5–12.5)
Platelets: 211 10*3/uL (ref 140–400)
RBC: 5.14 10*6/uL (ref 4.20–5.80)
RDW: 12.8 % (ref 11.0–15.0)
WBC: 6.3 10*3/uL (ref 3.8–10.8)

## 2021-03-26 LAB — COMPREHENSIVE METABOLIC PANEL
AG Ratio: 2.3 (calc) (ref 1.0–2.5)
ALT: 42 U/L (ref 9–46)
AST: 21 U/L (ref 10–35)
Albumin: 4.9 g/dL (ref 3.6–5.1)
Alkaline phosphatase (APISO): 97 U/L (ref 35–144)
BUN: 15 mg/dL (ref 7–25)
CO2: 24 mmol/L (ref 20–32)
Calcium: 9.7 mg/dL (ref 8.6–10.3)
Chloride: 105 mmol/L (ref 98–110)
Creat: 1.08 mg/dL (ref 0.70–1.33)
Globulin: 2.1 g/dL (calc) (ref 1.9–3.7)
Glucose, Bld: 104 mg/dL — ABNORMAL HIGH (ref 65–99)
Potassium: 4.1 mmol/L (ref 3.5–5.3)
Sodium: 138 mmol/L (ref 135–146)
Total Bilirubin: 0.5 mg/dL (ref 0.2–1.2)
Total Protein: 7 g/dL (ref 6.1–8.1)

## 2021-03-26 LAB — LIPID PANEL
Cholesterol: 148 mg/dL (ref <200)
HDL: 38 mg/dL — ABNORMAL LOW (ref >=40)
LDL Cholesterol (Calc): 91 mg/dL (calc)
Non-HDL Cholesterol (Calc): 110 mg/dL (calc) (ref <130)
Total CHOL/HDL Ratio: 3.9 (calc) (ref <5.0)
Triglycerides: 101 mg/dL (ref <150)

## 2021-03-26 LAB — RPR: RPR Ser Ql: NONREACTIVE

## 2021-03-26 LAB — HIV-1 RNA QUANT-NO REFLEX-BLD
HIV 1 RNA Quant: NOT DETECTED Copies/mL
HIV-1 RNA Quant, Log: NOT DETECTED Log cps/mL

## 2021-04-03 ENCOUNTER — Other Ambulatory Visit: Payer: Self-pay

## 2021-04-04 ENCOUNTER — Encounter: Payer: Self-pay | Admitting: Internal Medicine

## 2021-04-04 ENCOUNTER — Ambulatory Visit: Payer: PRIVATE HEALTH INSURANCE | Admitting: Internal Medicine

## 2021-04-04 DIAGNOSIS — M2550 Pain in unspecified joint: Secondary | ICD-10-CM | POA: Diagnosis not present

## 2021-04-04 DIAGNOSIS — E669 Obesity, unspecified: Secondary | ICD-10-CM | POA: Diagnosis not present

## 2021-04-04 DIAGNOSIS — B20 Human immunodeficiency virus [HIV] disease: Secondary | ICD-10-CM | POA: Diagnosis not present

## 2021-04-04 DIAGNOSIS — I10 Essential (primary) hypertension: Secondary | ICD-10-CM | POA: Diagnosis not present

## 2021-04-04 LAB — VITAMIN B12: Vitamin B-12: 307 pg/mL (ref 211–911)

## 2021-04-04 LAB — VITAMIN D 25 HYDROXY (VIT D DEFICIENCY, FRACTURES): VITD: 34.15 ng/mL (ref 30.00–100.00)

## 2021-04-04 LAB — TSH: TSH: 1.31 u[IU]/mL (ref 0.35–4.50)

## 2021-04-04 NOTE — Progress Notes (Signed)
Established Patient Office Visit     This visit occurred during the SARS-CoV-2 public health emergency.  Safety protocols were in place, including screening questions prior to the visit, additional usage of staff PPE, and extensive cleaning of exam room while observing appropriate contact time as indicated for disinfecting solutions.    CC/Reason for Visit: Discussed weight loss, joint pains  HPI: Edward Hendricks is a 60 y.o. male who is coming in today for the above mentioned reasons. Past Medical History is significant for: HIV, hypertension, status post L5/S1 decompression in December 2019, GERD and Barrett's esophagus who follows with EGDs yearly.  I have not seen him in some time.  He has gained some weight.  He has had progressive back issues and joint pains that he relates to his weight.  He wonders what he can do.   Past Medical/Surgical History: Past Medical History:  Diagnosis Date  . Allergy    seasonal  . Arthritis    "hands" (12/04/2018)  . Barrett's esophagus   . GERD (gastroesophageal reflux disease)   . HIV (human immunodeficiency virus infection) ([place]) dx'd 2002  . Hypertension   . IBS (irritable bowel syndrome)   . Personal history of colonic adenomas 03/10/2013  . Pneumonia 1990s    from HIV    Past Surgical History:  Procedure Laterality Date  . BACK SURGERY    . COLONOSCOPY    . COLONOSCOPY W/ BIOPSIES AND POLYPECTOMY    . ESOPHAGOGASTRODUODENOSCOPY     x2  . LUMBAR LAMINECTOMY/DECOMPRESSION MICRODISCECTOMY  12/04/2018   L5-S1  . LUMBAR LAMINECTOMY/DECOMPRESSION MICRODISCECTOMY N/A 12/04/2018   Procedure: Microlumbar decompression L5-S1;  Surgeon: Susa Day, MD;  Location: [facility];  Service: Orthopedics;  Laterality: N/A;  120 mins  . POLYPECTOMY    . TONSILLECTOMY    . UPPER GASTROINTESTINAL ENDOSCOPY    . WISDOM TOOTH EXTRACTION  2009    Social History:  reports that he has never smoked. He has never used smokeless tobacco. He reports  previous alcohol use. He reports that he does not use drugs.  Allergies: Allergies  Allergen Reactions  . Amoxicillin Rash    Family History:  Family History  Problem Relation Age of Onset  . Depression Mother        previously hospitalized  . Colon cancer Neg Hx   . Colon polyps Neg Hx   . Esophageal cancer Neg Hx   . Rectal cancer Neg Hx   . Stomach cancer Neg Hx      Current Outpatient Medications:  .  acetaminophen (TYLENOL) 500 MG tablet, Take 500 mg by mouth in the morning and at bedtime., Disp: , Rfl:  .  amLODipine (NORVASC) 10 MG tablet, TAKE 1 TABLET BY MOUTH EVERY DAY, Disp: 90 tablet, Rfl: 0 .  atorvastatin (LIPITOR) 40 MG tablet, Take 1 tablet (40 mg total) by mouth daily., Disp: 90 tablet, Rfl: 0 .  diclofenac sodium (VOLTAREN) 1 % GEL, Apply 1 application topically at bedtime., Disp: , Rfl:  .  Docusate Sodium (DSS) 100 MG CAPS, docusate sodium 100 mg capsule  TAKE 1 CAPSULE BY MOUTH TWICE A DAY, Disp: , Rfl:  .  famotidine (PEPCID) 40 MG tablet, Take 40 mg by mouth at bedtime., Disp: , Rfl:  .  fexofenadine (ALLEGRA) 180 MG tablet, Take 180 mg by mouth daily., Disp: , Rfl:  .  Multiple Vitamins-Minerals (EYE VITAMINS & MINERALS PO), Take by mouth in the morning and at bedtime., Disp: , Rfl:  .  ODEFSEY 200-25-25 MG TABS tablet, TAKE 1 TABLET BY MOUTH ONCE DAILY WITH BREAKFAST, Disp: 90 tablet, Rfl: 0 .  pantoprazole (PROTONIX) 40 MG tablet, Take 1 tablet (40 mg total) by mouth every other day., Disp: 90 tablet, Rfl: 3 .  Vitamin D, Cholecalciferol, 50 MCG (2000 UT) CAPS, Take 1 capsule by mouth daily., Disp: , Rfl:   Review of Systems:  Constitutional: Denies fever, chills, diaphoresis, appetite change and fatigue.  HEENT: Denies photophobia, eye pain, redness, hearing loss, ear pain, congestion, sore throat, rhinorrhea, sneezing, mouth sores, trouble swallowing, neck pain, neck stiffness and tinnitus.   Respiratory: Denies SOB, DOE, cough, chest tightness,  and  wheezing.   Cardiovascular: Denies chest pain, palpitations and leg swelling.  Gastrointestinal: Denies nausea, vomiting, abdominal pain, diarrhea, constipation, blood in stool and abdominal distention.  Genitourinary: Denies dysuria, urgency, frequency, hematuria, flank pain and difficulty urinating.  Endocrine: Denies: hot or cold intolerance, sweats, changes in hair or nails, polyuria, polydipsia. Musculoskeletal: Denies myalgias, back pain, joint swelling, and gait problem.  Skin: Denies pallor, rash and wound.  Neurological: Denies dizziness, seizures, syncope, weakness, light-headedness, numbness and headaches.  Hematological: Denies adenopathy. Easy bruising, personal or family bleeding history  Psychiatric/Behavioral: Denies suicidal ideation, mood changes, confusion, nervousness, sleep disturbance and agitation    Physical Exam: Vitals:   04/04/21 1121  BP: 130/80  Pulse: 88  Temp: 98.1 F (36.7 C)  TempSrc: Oral  SpO2: 96%  Weight: 287 lb 3.2 oz (130.3 kg)    Body mass index is 42.41 kg/m.   Constitutional: NAD, calm, comfortable, obese Eyes: PERRL, lids and conjunctivae normal, wears corrective lenses ENMT: Mucous membranes are moist.  Respiratory: clear to auscultation bilaterally, no wheezing, no crackles. Normal respiratory effort. No accessory muscle use.  Cardiovascular: Regular rate and rhythm, no murmurs / rubs / gallops. No extremity edema. Neurologic: Grossly intact and nonfocal Psychiatric: Normal judgment and insight. Alert and oriented x 3. Normal mood.    Impression and Plan:  Morbid obesity (HCC) Arthralgia, unspecified joint  - Plan: TSH, Vitamin B12, VITAMIN D 25 Hydroxy (Vit-D Deficiency, Fractures), VITAMIN D 25 Hydroxy (Vit-D Deficiency, Fractures), Vitamin B12, TSH -Referral to medical weight management.  He is not interested in bariatric surgery at this time. -We have discussed lifestyle changes in detail.  Primary  hypertension -Well-controlled on current regimen of amlodipine.  Human immunodeficiency virus (HIV) disease ([place]) -Followed by infectious diseases, stable  Time spent: 33 minutes collecting history, examining patient and discussing different eating plans with him.    [name], MD [facility]

## 2021-04-06 ENCOUNTER — Encounter: Payer: PRIVATE HEALTH INSURANCE | Admitting: Infectious Diseases

## 2021-04-11 ENCOUNTER — Ambulatory Visit: Payer: PRIVATE HEALTH INSURANCE | Admitting: Infectious Diseases

## 2021-04-11 ENCOUNTER — Encounter: Payer: Self-pay | Admitting: Infectious Diseases

## 2021-04-11 ENCOUNTER — Other Ambulatory Visit: Payer: Self-pay

## 2021-04-11 DIAGNOSIS — E669 Obesity, unspecified: Secondary | ICD-10-CM | POA: Diagnosis not present

## 2021-04-11 DIAGNOSIS — I10 Essential (primary) hypertension: Secondary | ICD-10-CM | POA: Diagnosis not present

## 2021-04-11 DIAGNOSIS — K219 Gastro-esophageal reflux disease without esophagitis: Secondary | ICD-10-CM

## 2021-04-11 DIAGNOSIS — B20 Human immunodeficiency virus [HIV] disease: Secondary | ICD-10-CM | POA: Diagnosis not present

## 2021-04-11 DIAGNOSIS — J301 Allergic rhinitis due to pollen: Secondary | ICD-10-CM

## 2021-04-11 MED ORDER — ODEFSEY 200-25-25 MG PO TABS: 1.0000 | ORAL_TABLET | Freq: Every day | ORAL | 3 refills | Status: DC

## 2021-04-11 NOTE — Assessment & Plan Note (Signed)
He is doing well from his viral perspective.  working on wt loss which will help other issues- edema, HTN, back pain.  Husband HIV-, tested yearly. Not on prep.  We discussed switching meds (dovato?) to see if that would help, he defers.  Will see him back in 9 months, labs prior.  Will check his testosterone as well, prev on supplements.

## 2021-04-11 NOTE — Assessment & Plan Note (Signed)
To get repeat EGD this year. He is working on scheduling.

## 2021-04-11 NOTE — Assessment & Plan Note (Signed)
Up today.  Greatly appreciate his excellent PCP.

## 2021-04-11 NOTE — Addendum Note (Signed)
Addended by: Tarig Zimmers C on: 04/11/2021 03:38 PM   Modules accepted: Orders

## 2021-04-11 NOTE — Assessment & Plan Note (Signed)
He is to f/u with healthy wt and wellness.

## 2021-04-11 NOTE — Assessment & Plan Note (Signed)
Takes Materials engineer.

## 2021-04-11 NOTE — Progress Notes (Signed)
    Subjective:    Patient ID: Edward Hendricks, male  DOB: Apr 20, 1961, 59 y.o.        MRN: [id number]   HPI 60yo M with hx of HIV+ and HTN. On norvasc, atripla --->odefsy.  Had L5-S1 micro-decompression on 12-04-18.  Still having some back discomfort. Saw PCP last week for wt loss program.  Has been using treadmill less, has pain next day. Taking acetaminophen. Muscle relaxer tid.    Spacing odefsey 12h from his GERD rx.  Had colon 2021. 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 Gets repeated yearly.  Appt for 2022 pending.   Abd feels better. Has some occas RUQ discomfort- "inflamed, mild burn sensation".   Has not been able to cut his own hair recently due to arthritis.    HIV 1 RNA Quant  Date Value  03/23/2021 Not Detected Copies/mL  06/21/2020 <20 NOT DETECTED copies/mL  09/28/2019 <20 NOT DETECTED copies/mL   CD4 T Cell Abs (/uL)  Date Value  03/23/2021 701  06/21/2020 750  09/28/2019 720     Health Maintenance  Topic Date Due  . INFLUENZA VACCINE  07/17/2021  . COVID-19 Vaccine (4 - Booster for [place] series) 10/08/2021  . COLONOSCOPY (Pts 45-28yrs Insurance coverage will need to be confirmed)  12/23/2022  . TETANUS/TDAP  09/22/2023  . Hepatitis C Screening  Completed  . HIV Screening  Completed  . HPV VACCINES  Aged Out   Just got COVID booster, did not get flu vax this year.    Review of Systems  Constitutional: Negative for chills, fever and weight loss.  HENT: Negative for congestion.   Eyes: Positive for redness.  Respiratory: Positive for cough (tree pollen) and shortness of breath.   Cardiovascular: Positive for leg swelling.  Gastrointestinal: Negative for constipation and diarrhea.  Genitourinary: Negative for dysuria.    Please see HPI. All other systems reviewed and negative.     Objective:  Physical Exam Constitutional:      Appearance: Normal appearance. He is obese.  HENT:     Mouth/Throat:     Mouth: Mucous membranes are moist.      Pharynx: No oropharyngeal exudate.  Eyes:     Extraocular Movements: Extraocular movements intact.     Pupils: Pupils are equal, round, and reactive to light.  Cardiovascular:     Rate and Rhythm: Normal rate and regular rhythm.  Pulmonary:     Effort: Pulmonary effort is normal.     Breath sounds: Normal breath sounds.  Abdominal:     General: Bowel sounds are normal. There is no distension.     Palpations: Abdomen is soft.     Tenderness: There is no abdominal tenderness.  Musculoskeletal:        General: Normal range of motion.     Cervical back: Normal range of motion and neck supple.     Right lower leg: Edema present.     Left lower leg: Edema present.  Neurological:     Mental Status: He is alert.  Psychiatric:        Mood and Affect: Mood normal.            Assessment & Plan:

## 2021-04-15 ENCOUNTER — Other Ambulatory Visit: Payer: Self-pay | Admitting: Infectious Diseases

## 2021-04-15 DIAGNOSIS — I10 Essential (primary) hypertension: Secondary | ICD-10-CM

## 2021-05-01 ENCOUNTER — Encounter: Payer: Self-pay | Admitting: Internal Medicine

## 2021-05-01 DIAGNOSIS — E781 Pure hyperglyceridemia: Secondary | ICD-10-CM

## 2021-05-02 MED ORDER — ATORVASTATIN CALCIUM 40 MG PO TABS: 40.0000 mg | ORAL_TABLET | Freq: Every day | ORAL | 0 refills | Status: DC

## 2021-05-09 ENCOUNTER — Other Ambulatory Visit: Payer: Self-pay | Admitting: Infectious Diseases

## 2021-05-09 DIAGNOSIS — I10 Essential (primary) hypertension: Secondary | ICD-10-CM

## 2021-05-30 ENCOUNTER — Ambulatory Visit (INDEPENDENT_AMBULATORY_CARE_PROVIDER_SITE_OTHER): Payer: No Typology Code available for payment source | Admitting: Bariatrics

## 2021-05-30 ENCOUNTER — Encounter (INDEPENDENT_AMBULATORY_CARE_PROVIDER_SITE_OTHER): Payer: Self-pay | Admitting: Bariatrics

## 2021-05-30 ENCOUNTER — Other Ambulatory Visit: Payer: Self-pay

## 2021-05-30 VITALS — BP 142/84 | HR 89 | Temp 98.3°F | Ht 68.0 in | Wt 280.0 lb

## 2021-05-30 DIAGNOSIS — Z0289 Encounter for other administrative examinations: Secondary | ICD-10-CM

## 2021-05-30 DIAGNOSIS — R7309 Other abnormal glucose: Secondary | ICD-10-CM

## 2021-05-30 DIAGNOSIS — K219 Gastro-esophageal reflux disease without esophagitis: Secondary | ICD-10-CM | POA: Diagnosis not present

## 2021-05-30 DIAGNOSIS — R5383 Other fatigue: Secondary | ICD-10-CM

## 2021-05-30 DIAGNOSIS — R0602 Shortness of breath: Secondary | ICD-10-CM | POA: Diagnosis not present

## 2021-05-30 DIAGNOSIS — Z9189 Other specified personal risk factors, not elsewhere classified: Secondary | ICD-10-CM | POA: Diagnosis not present

## 2021-05-30 DIAGNOSIS — I10 Essential (primary) hypertension: Secondary | ICD-10-CM

## 2021-05-30 DIAGNOSIS — Z6841 Body Mass Index (BMI) 40.0 and over, adult: Secondary | ICD-10-CM

## 2021-05-30 DIAGNOSIS — Z1331 Encounter for screening for depression: Secondary | ICD-10-CM | POA: Diagnosis not present

## 2021-05-30 DIAGNOSIS — M1990 Unspecified osteoarthritis, unspecified site: Secondary | ICD-10-CM

## 2021-05-30 DIAGNOSIS — E781 Pure hyperglyceridemia: Secondary | ICD-10-CM

## 2021-05-31 LAB — HEMOGLOBIN A1C
Est. average glucose Bld gHb Est-mCnc: 105 mg/dL
Hgb A1c MFr Bld: 5.3 % (ref 4.8–5.6)

## 2021-05-31 LAB — INSULIN, RANDOM: INSULIN: 35.9 u[IU]/mL — ABNORMAL HIGH (ref 2.6–24.9)

## 2021-06-01 NOTE — Progress Notes (Signed)
Dear Dr. Deniece Ree,   Thank you for referring Edward Hendricks to our clinic. The following note includes my evaluation and treatment recommendations.  Chief Complaint:   OBESITY Edward Hendricks (MR# [id number]) is a 60 y.o. male who presents for evaluation and treatment of obesity and related comorbidities. Current BMI is Body mass index is 42.57 kg/m. Edward Hendricks has been struggling with his weight for many years and has been unsuccessful in either losing weight, maintaining weight loss, or reaching his healthy weight goal.  Edward Hendricks is currently in the action stage of change and ready to dedicate time achieving and maintaining a healthier weight. Edward Hendricks is interested in becoming our patient and working on intensive lifestyle modifications including (but not limited to) diet and exercise for weight loss.  Edward Hendricks does like to cook and denies any obstacles.  Edward Hendricks's habits were reviewed today and are as follows: His family eats meals together, he thinks his family will eat healthier with him, his desired weight loss is 120 lbs, he started gaining weight 10 years ago, his heaviest weight ever was 280 pounds, he skips meals frequently, he is frequently drinking liquids with calories, he frequently eats larger portions than normal, and he has binge eating behaviors.  Depression Screen Edward Hendricks's Food and Mood (modified PHQ-9) score was 7.  Depression screen [facility] 2/9 05/30/2021  Decreased Interest 3  Down, Depressed, Hopeless 1  PHQ - 2 Score 4  Altered sleeping 0  Tired, decreased energy 3  Change in appetite 0  Feeling bad or failure about yourself  0  Trouble concentrating 0  Moving slowly or fidgety/restless 0  Suicidal thoughts 0  PHQ-9 Score 7  Difficult doing work/chores Not difficult at all   Subjective:   1. Other fatigue San admits to daytime somnolence and admits to waking up still tired. Patent has a history of symptoms of daytime fatigue and morning fatigue. Kymani generally  gets 7 or 8 hours of sleep per night, and states that he has generally restful sleep. Snoring is present. Apneic episodes are not present. Epworth Sleepiness Score is 6.  2. SOB (shortness of breath) on exertion Edward Hendricks notes increasing shortness of breath with exercising and seems to be worsening over time with weight gain. He notes getting out of breath sooner with activity than he used to. This has gotten worse recently. Jcion denies shortness of breath at rest or orthopnea.  3. Essential hypertension Abundio's BP is reasonably well controlled.  BP Readings from Last 3 Encounters:  05/30/21 (!) 142/84  04/11/21 (!) 150/102  04/04/21 130/80   4. Gastroesophageal reflux disease, unspecified whether esophagitis present Edward Hendricks is taking pantoprazole and famotidine. With Barrett's esophagitis without dysplasia.   5. Arthritis Edward Hendricks reports back, muscle, and joint pain.  6. Elevated glucose Edward Hendricks's glucose level 104.  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  8. At risk for activity intolerance Edward Hendricks is at risk for exercise intolerance due to obesity and weather.  Assessment/Plan:   1. Other fatigue Edward Hendricks does feel that his weight is causing his energy to be lower than it should be. Fatigue may be related to obesity, depression or many other causes. Labs will be ordered, and in the meanwhile, Mace will focus on self care including making healthy food choices, increasing physical activity and focusing on stress reduction.  - EKG 12-Lead - Hemoglobin A1c - Insulin, random  2. SOB (shortness of breath) on exertion Edward Hendricks does feel that he gets out of breath more easily  that he used to when he exercises. Pranish's shortness of breath appears to be obesity related and exercise induced. He has agreed to work on weight loss and gradually increase exercise to treat his exercise induced shortness of breath. Will continue to monitor closely.  3. Essential  hypertension Edward Hendricks is working on healthy weight loss and exercise to improve blood pressure control. We will watch for signs of hypotension as he continues his lifestyle modifications. Continue current treatment plan.  4. Gastroesophageal reflux disease, unspecified whether esophagitis present Intensive lifestyle modifications are the first line treatment for this issue. We discussed several lifestyle modifications today and he will continue to work on diet, exercise and weight loss efforts. Orders and follow up as documented in patient record. Continue current treatment plan.  Counseling If a person has gastroesophageal reflux disease (GERD), food and stomach acid move back up into the esophagus and cause symptoms or problems such as damage to the esophagus. Anti-reflux measures include: raising the head of the bed, avoiding tight clothing or belts, avoiding eating late at night, not lying down shortly after mealtime, and achieving weight loss. Avoid ASA, NSAID's, caffeine, alcohol, and tobacco.  OTC Pepcid and/or Tums are often very helpful for as needed use.  However, for persisting chronic or daily symptoms, stronger medications like Omeprazole may be needed. You may need to avoid foods and drinks such as: Coffee and tea (with or without caffeine). Drinks that contain alcohol. Energy drinks and sports drinks. Bubbly (carbonated) drinks or sodas. Chocolate and cocoa. Peppermint and mint flavorings. Garlic and onions. Horseradish. Spicy and acidic foods. These include peppers, chili powder, curry powder, vinegar, hot sauces, and BBQ sauce. Citrus fruit juices and citrus fruits, such as oranges, lemons, and limes. Tomato-based foods. These include red sauce, chili, salsa, and pizza with red sauce. Fried and fatty foods. These include donuts, french fries, potato chips, and high-fat dressings. High-fat meats. These include hot dogs, rib eye steak, sausage, ham, and bacon.  5.  Arthritis Gradually increase activities as tolerated. No pounding exercises.  6. Elevated glucose Fasting labs will be obtained and results with be discussed with Edward Hendricks in 2 weeks at his follow up visit. In the meanwhile Camryn was started on a lower simple carbohydrate diet and will work on weight loss efforts.  - Hemoglobin A1c - Insulin, random  7. Endogenous hyperlipidemia Cardiovascular risk and specific lipid/LDL goals reviewed.  We discussed several lifestyle modifications today and Edward Hendricks will continue to work on diet, exercise and weight loss efforts. Orders and follow up as documented in patient record. Continue current treatment plan.  Counseling Intensive lifestyle modifications are the first line treatment for this issue. Dietary changes: Increase soluble fiber. Decrease simple carbohydrates. Exercise changes: Moderate to vigorous-intensity aerobic activity 150 minutes per week if tolerated. Lipid-lowering medications: see documented in medical record.  8. Depression screen Edward Hendricks had a positive depression screening. Depression is commonly associated with obesity and often results in emotional eating behaviors. We will monitor this closely and work on CBT to help improve the non-hunger eating patterns. Referral to Psychology may be required if no improvement is seen as he continues in our clinic.  9. At risk for activity intolerance Edward Hendricks was given approximately 15 minutes of exercise intolerance counseling today. He is 60 y.o. male and has risk factors exercise intolerance including obesity. We discussed intensive lifestyle modifications today with an emphasis on specific weight loss instructions and strategies. Edward Hendricks will slowly increase activity as tolerated.  Repetitive spaced learning was  employed today to elicit superior memory formation and behavioral change.   10. Class 3 severe obesity with serious comorbidity and body mass index (BMI) of 40.0 to 44.9 in adult,  unspecified obesity type (HCC)  Edward Hendricks is currently in the action stage of change and his goal is to continue with weight loss efforts. I recommend Edward Hendricks begin the structured treatment plan as follows:  Meal plan 03/23/2021 labs reviewed Decrease soda and taper due to caffeine.  He has agreed to the Category 4 Plan.  Exercise goals: No exercise has been prescribed at this time.   Behavioral modification strategies: increasing lean protein intake, decreasing simple carbohydrates, increasing vegetables, increasing water intake, decreasing eating out, no skipping meals, meal planning and cooking strategies, keeping healthy foods in the home, and planning for success.  He was informed of the importance of frequent follow-up visits to maximize his success with intensive lifestyle modifications for his multiple health conditions. He was informed we would discuss his lab results at his next visit unless there is a critical issue that needs to be addressed sooner. Edward Hendricks agreed to keep his next visit at the agreed upon time to discuss these results.  Objective:   Blood pressure (!) 142/84, pulse 89, temperature 98.3 F (36.8 C), height 5\' 8"  (1.727 m), weight 280 lb (127 kg), SpO2 96 %. Body mass index is 42.57 kg/m.  EKG: Normal sinus rhythm, rate 87.  Indirect Calorimeter completed today shows a VO2 of 413 and a REE of 2877.  His calculated basal metabolic rate is 5188 thus his basal metabolic rate is better than expected.  General: Cooperative, alert, well developed, in no acute distress. HEENT: Conjunctivae and lids unremarkable. Cardiovascular: Regular rhythm.  Lungs: Normal work of breathing. Neurologic: No focal deficits.   Lab Results  Component Value Date   CREATININE 1.08 03/23/2021   BUN 15 03/23/2021   NA 138 03/23/2021   K 4.1 03/23/2021   CL 105 03/23/2021   CO2 24 03/23/2021   Lab Results  Component Value Date   ALT 42 03/23/2021   AST 21 03/23/2021   ALKPHOS 90  11/06/2019   BILITOT 0.5 03/23/2021   Lab Results  Component Value Date   HGBA1C 5.3 05/30/2021   HGBA1C 5.1 11/06/2019   Lab Results  Component Value Date   INSULIN 35.9 (H) 05/30/2021   Lab Results  Component Value Date   TSH 1.31 04/04/2021   Lab Results  Component Value Date   CHOL 148 03/23/2021   HDL 38 (L) 03/23/2021   LDLCALC 91 03/23/2021   TRIG 101 03/23/2021   CHOLHDL 3.9 03/23/2021   Lab Results  Component Value Date   WBC 6.3 03/23/2021   HGB 16.0 03/23/2021   HCT 46.8 03/23/2021   MCV 91.1 03/23/2021   PLT 211 03/23/2021   No results found for: IRON, TIBC, FERRITIN  Attestation Statements:   Reviewed by clinician on day of visit: allergies, medications, problem list, medical history, surgical history, family history, social history, and previous encounter notes.  Coral Ceo, CMA, am acting as Location manager for [employer], DO.  I have reviewed the above documentation for accuracy and completeness, and I agree with the above. Jearld Lesch, DO

## 2021-06-08 ENCOUNTER — Ambulatory Visit (INDEPENDENT_AMBULATORY_CARE_PROVIDER_SITE_OTHER): Payer: Self-pay | Admitting: Bariatrics

## 2021-06-12 ENCOUNTER — Encounter (INDEPENDENT_AMBULATORY_CARE_PROVIDER_SITE_OTHER): Payer: Self-pay | Admitting: Bariatrics

## 2021-06-13 ENCOUNTER — Ambulatory Visit (INDEPENDENT_AMBULATORY_CARE_PROVIDER_SITE_OTHER): Payer: No Typology Code available for payment source | Admitting: Bariatrics

## 2021-06-13 ENCOUNTER — Other Ambulatory Visit: Payer: Self-pay

## 2021-06-13 ENCOUNTER — Encounter (INDEPENDENT_AMBULATORY_CARE_PROVIDER_SITE_OTHER): Payer: Self-pay | Admitting: Bariatrics

## 2021-06-13 VITALS — BP 148/80 | HR 97 | Temp 98.2°F | Ht 68.0 in | Wt 273.0 lb

## 2021-06-13 DIAGNOSIS — Z9189 Other specified personal risk factors, not elsewhere classified: Secondary | ICD-10-CM | POA: Diagnosis not present

## 2021-06-13 DIAGNOSIS — I10 Essential (primary) hypertension: Secondary | ICD-10-CM

## 2021-06-13 DIAGNOSIS — Z6841 Body Mass Index (BMI) 40.0 and over, adult: Secondary | ICD-10-CM

## 2021-06-13 DIAGNOSIS — E8881 Metabolic syndrome: Secondary | ICD-10-CM

## 2021-06-15 ENCOUNTER — Encounter (INDEPENDENT_AMBULATORY_CARE_PROVIDER_SITE_OTHER): Payer: Self-pay | Admitting: Bariatrics

## 2021-06-15 NOTE — Progress Notes (Signed)
Chief Complaint:   OBESITY Edward Hendricks is here to discuss his progress with his obesity treatment plan along with follow-up of his obesity related diagnoses. Edward Hendricks is on the Category 4 Plan and states he is following his eating plan approximately 100% of the time. [place] states he is walking on the treadmill 5-10 minutes 7 times per week.  Today's visit was #: 2 Starting weight: 280 lbs Starting date: 05/30/2021 Today's weight: 273 lbs Today's date: 06/13/2021 Total lbs lost to date: 7 Total lbs lost since last in-office visit: 7  Interim History: Edward Hendricks is down 7 lbs since his last visit. He was surprised about how much food is on the plan.  Subjective:   1. Insulin resistance Edward Hendricks's insulin level is 35.9 and A1c 5.3.  Lab Results  Component Value Date   INSULIN 35.9 (H) 05/30/2021   Lab Results  Component Value Date   HGBA1C 5.3 05/30/2021   2. Essential hypertension Edward Hendricks's BP is slightly elevated . He is taking Norvasc.  BP Readings from Last 3 Encounters:  06/13/21 (!) 148/80  05/30/21 (!) 142/84  04/11/21 (!) 150/102   3. At risk for diabetes mellitus Edward Hendricks is at higher than average risk for developing diabetes due to obesity and insulin resistance.  Assessment/Plan:   1. Insulin resistance Edward Hendricks will continue to work on weight loss, exercise, and decreasing simple carbohydrates to help decrease the risk of diabetes. Edward Hendricks agreed to follow-up with Edward Hendricks as directed to closely monitor his progress. Handout: Insulin Resistance  2. Essential hypertension Aayush is working on healthy weight loss and exercise to improve blood pressure control. We will watch for signs of hypotension as he continues his lifestyle modifications. Continue current treatment plan.  3. At risk for diabetes mellitus Edward Hendricks was given approximately 15 minutes of diabetes education and counseling today. We discussed intensive lifestyle modifications today with an emphasis on weight loss as  well as increasing exercise and decreasing simple carbohydrates in his diet. We also reviewed medication options with an emphasis on risk versus benefit of those discussed.   Repetitive spaced learning was employed today to elicit superior memory formation and behavioral change.  4. Obesity, current BMI 41.6  Edward Hendricks is currently in the action stage of change. As such, his goal is to continue with weight loss efforts. He has agreed to the Category 4 Plan and keeping a food journal and adhering to recommended goals of 1700-1800 calories and 100+ g protein.   Meal plan 05/30/2021 labs reviewed. Will not drink sugary beverages. Protein Equivalents  Exercise goals:  As is  Behavioral modification strategies: increasing lean protein intake, decreasing simple carbohydrates, increasing vegetables, increasing water intake, decreasing eating out, no skipping meals, meal planning and cooking strategies, keeping healthy foods in the home, and planning for success.  Edward Hendricks has agreed to follow-up with our clinic in 2 weeks. He was informed of the importance of frequent follow-up visits to maximize his success with intensive lifestyle modifications for his multiple health conditions.   Objective:   Blood pressure (!) 148/80, pulse 97, temperature 98.2 F (36.8 C), height 5\' 8"  (1.727 m), weight 273 lb (123.8 kg), SpO2 96 %. Body mass index is 41.51 kg/m.  General: Cooperative, alert, well developed, in no acute distress. HEENT: Conjunctivae and lids unremarkable. Cardiovascular: Regular rhythm.  Lungs: Normal work of breathing. Neurologic: No focal deficits.   Lab Results  Component Value Date   CREATININE 1.08 03/23/2021   BUN 15 03/23/2021   NA 138  03/23/2021   K 4.1 03/23/2021   CL 105 03/23/2021   CO2 24 03/23/2021   Lab Results  Component Value Date   ALT 42 03/23/2021   AST 21 03/23/2021   ALKPHOS 90 11/06/2019   BILITOT 0.5 03/23/2021   Lab Results  Component Value Date    HGBA1C 5.3 05/30/2021   HGBA1C 5.1 11/06/2019   Lab Results  Component Value Date   INSULIN 35.9 (H) 05/30/2021   Lab Results  Component Value Date   TSH 1.31 04/04/2021   Lab Results  Component Value Date   CHOL 148 03/23/2021   HDL 38 (L) 03/23/2021   LDLCALC 91 03/23/2021   TRIG 101 03/23/2021   CHOLHDL 3.9 03/23/2021   Lab Results  Component Value Date   VD25OH 34.15 04/04/2021   VD25OH 56.29 11/06/2019   Lab Results  Component Value Date   WBC 6.3 03/23/2021   HGB 16.0 03/23/2021   HCT 46.8 03/23/2021   MCV 91.1 03/23/2021   PLT 211 03/23/2021   No results found for: IRON, TIBC, FERRITIN  Attestation Statements:   Reviewed by clinician on day of visit: allergies, medications, problem list, medical history, surgical history, family history, social history, and previous encounter notes.  Coral Ceo, CMA, am acting as Location manager for [employer], DO.  I have reviewed the above documentation for accuracy and completeness, and I agree with the above. Jearld Lesch, DO

## 2021-07-03 ENCOUNTER — Encounter (INDEPENDENT_AMBULATORY_CARE_PROVIDER_SITE_OTHER): Payer: Self-pay | Admitting: Bariatrics

## 2021-07-03 ENCOUNTER — Ambulatory Visit (INDEPENDENT_AMBULATORY_CARE_PROVIDER_SITE_OTHER): Payer: No Typology Code available for payment source | Admitting: Bariatrics

## 2021-07-03 ENCOUNTER — Other Ambulatory Visit: Payer: Self-pay

## 2021-07-03 VITALS — BP 131/79 | HR 100 | Temp 99.1°F | Ht 68.0 in | Wt 267.0 lb

## 2021-07-03 DIAGNOSIS — I10 Essential (primary) hypertension: Secondary | ICD-10-CM

## 2021-07-03 DIAGNOSIS — E7849 Other hyperlipidemia: Secondary | ICD-10-CM | POA: Diagnosis not present

## 2021-07-03 DIAGNOSIS — Z6841 Body Mass Index (BMI) 40.0 and over, adult: Secondary | ICD-10-CM | POA: Diagnosis not present

## 2021-07-05 NOTE — Progress Notes (Signed)
Chief Complaint:   OBESITY Edward Hendricks is here to discuss his progress with his obesity treatment plan along with follow-up of his obesity related diagnoses. Edward Hendricks is on the Category 3 Plan and states he is following his eating plan approximately 99% of the time. Edward Hendricks states he is doing the treadmill for 15 minutes 3-4 times per week.  Today's visit was #: 3 Starting weight: 280 lbs Starting date: 05/30/2021 Today's weight: 267 lbs Today's date: 07/03/2021 Total lbs lost to date: 13 lbs Total lbs lost since last in-office visit: 6 lbs  Interim History: Edward Hendricks is down an additional 6 lbs since his last visit. He stated that he feels differently. He denies any struggles.  Subjective:   1. Essential hypertension Edward Hendricks is taking Norvasc. His hypertension is controlled.  2. Other hyperlipidemia Edward Hendricks is taking Lipitor. He denies side effects.  Assessment/Plan:   1. Essential hypertension Edward Hendricks will continue his medication and he is working on healthy weight loss and exercise to improve blood pressure control. We will watch for signs of hypotension as he continues his lifestyle modifications.   2. Other hyperlipidemia Edward Hendricks will continue taking his medication. Cardiovascular risk and specific lipid/LDL goals reviewed.  We discussed several lifestyle modifications today and Edward Hendricks will continue to work on diet, exercise and weight loss efforts. Orders and follow up as documented in patient record.   Counseling Intensive lifestyle modifications are the first line treatment for this issue. Dietary changes: Increase soluble fiber. Decrease simple carbohydrates. Exercise changes: Moderate to vigorous-intensity aerobic activity 150 minutes per week if tolerated. Lipid-lowering medications: see documented in medical record.   3. Obesity, current BMI 40.6 Edward Hendricks is currently in the action stage of change. As such, his goal is to continue with weight loss efforts. He has agreed to  the Category 3 Plan.   Edward Hendricks will continue to follow the plan closely. He will be mindful eating. He will continue to increase H2O. He will continue to use the My Fitness Pal.  Exercise goals:  As is.  Behavioral modification strategies: increasing lean protein intake, decreasing simple carbohydrates, increasing vegetables, increasing water intake, decreasing eating out, no skipping meals, meal planning and cooking strategies, keeping healthy foods in the home, and planning for success.  Edward Hendricks has agreed to follow-up with our clinic in 2 weeks (fasting). He was informed of the importance of frequent follow-up visits to maximize his success with intensive lifestyle modifications for his multiple health conditions.   Objective:   Blood pressure 131/79, pulse 100, temperature 99.1 F (37.3 C), height 5\' 8"  (1.727 m), weight 267 lb (121.1 kg), SpO2 94 %. Body mass index is 40.6 kg/m.  General: Cooperative, alert, well developed, in no acute distress. HEENT: Conjunctivae and lids unremarkable. Cardiovascular: Regular rhythm.  Lungs: Normal work of breathing. Neurologic: No focal deficits.   Lab Results  Component Value Date   CREATININE 1.08 03/23/2021   BUN 15 03/23/2021   NA 138 03/23/2021   K 4.1 03/23/2021   CL 105 03/23/2021   CO2 24 03/23/2021   Lab Results  Component Value Date   ALT 42 03/23/2021   AST 21 03/23/2021   ALKPHOS 90 11/06/2019   BILITOT 0.5 03/23/2021   Lab Results  Component Value Date   HGBA1C 5.3 05/30/2021   HGBA1C 5.1 11/06/2019   Lab Results  Component Value Date   INSULIN 35.9 (H) 05/30/2021   Lab Results  Component Value Date   TSH 1.31 04/04/2021   Lab Results  Component Value Date   CHOL 148 03/23/2021   HDL 38 (L) 03/23/2021   LDLCALC 91 03/23/2021   TRIG 101 03/23/2021   CHOLHDL 3.9 03/23/2021   Lab Results  Component Value Date   VD25OH 34.15 04/04/2021   VD25OH 56.29 11/06/2019   Lab Results  Component Value Date   WBC  6.3 03/23/2021   HGB 16.0 03/23/2021   HCT 46.8 03/23/2021   MCV 91.1 03/23/2021   PLT 211 03/23/2021   No results found for: IRON, TIBC, FERRITIN  Attestation Statements:   Reviewed by clinician on day of visit: allergies, medications, problem list, medical history, surgical history, family history, social history, and previous encounter notes.  Time spent on visit including pre-visit chart review and post-visit care and charting was 20 minutes.   I, Lizbeth Bark, RMA, am acting as Location manager for [employer], DO.   I have reviewed the above documentation for accuracy and completeness, and I agree with the above. Jearld Lesch, DO

## 2021-07-06 ENCOUNTER — Encounter (INDEPENDENT_AMBULATORY_CARE_PROVIDER_SITE_OTHER): Payer: Self-pay | Admitting: Bariatrics

## 2021-07-17 ENCOUNTER — Ambulatory Visit: Payer: No Typology Code available for payment source | Admitting: Internal Medicine

## 2021-07-17 ENCOUNTER — Encounter: Payer: Self-pay | Admitting: Internal Medicine

## 2021-07-17 VITALS — BP 118/70 | HR 92 | Ht 68.0 in | Wt 267.4 lb

## 2021-07-17 DIAGNOSIS — K219 Gastro-esophageal reflux disease without esophagitis: Secondary | ICD-10-CM

## 2021-07-17 DIAGNOSIS — E669 Obesity, unspecified: Secondary | ICD-10-CM

## 2021-07-17 DIAGNOSIS — K2270 Barrett's esophagus without dysplasia: Secondary | ICD-10-CM

## 2021-07-17 DIAGNOSIS — E8881 Metabolic syndrome: Secondary | ICD-10-CM | POA: Diagnosis not present

## 2021-07-17 MED ORDER — PANTOPRAZOLE SODIUM 20 MG PO TBEC: 20.0000 mg | DELAYED_RELEASE_TABLET | ORAL | 1 refills | Status: DC

## 2021-07-17 NOTE — Patient Instructions (Addendum)
Stop your famotidine per Dr Carlean Purl and he is changing your pantoprazole to '20mg'$  every other day.  We are giving you healthy eating information to read and follow.  Congrats on your healthy eating and drinking less soft drinks.  Follow up in 3 months with Dr Carlean Purl.   I appreciate the opportunity to care for you. Silvano Rusk, MD, [facility]

## 2021-07-17 NOTE — Progress Notes (Signed)
Edward Hendricks 60 y.o. 03-14-1961 [id number]  Assessment & Plan:   Encounter Diagnoses  Name Primary?   Abdominal obesity and metabolic syndrome Yes   Barrett's esophagus without dysplasia    Gastroesophageal reflux disease without esophagitis    Its not entirely clear to me that he needs to continue PPI long-term, I would control reflux symptoms if need be but it is not essential to take PPI in asymptomatic Barrett's esophagus according to the most recent recommendations.  It is a little bit challenging with a PPI in him given his HIV medications and the need to separate those though he seems to be tolerating the combination.  He is congratulated about his interest and motivation and changes in eating patterns.  I have given him some information about time restricted eating and lower carbohydrate eating and resources that I use with my patients.  I think he is on track with this to a degree but pursuing this more so may be very beneficial to help with his metabolic syndrome issues.  He is concerned about going for visits every 2 weeks at the healthy weight clinic.  Note also that his HIV medications can cause lipodystrophy and weight gain.  Medications Discontinued During This Encounter  Medication Reason   tiZANidine (ZANAFLEX) 4 MG tablet Patient Preference   pantoprazole (PROTONIX) 40 MG tablet    famotidine (PEPCID) 40 MG tablet     Meds ordered this encounter  Medications   pantoprazole (PROTONIX) 20 MG tablet    Sig: Take 1 tablet (20 mg total) by mouth every other day.    Dispense:  45 tablet    Refill:  1    CC: Isaac Bliss, Rayford Halsted, MD   Subjective:   Chief Complaint: Reflux, Barrett's esophagus need for medications  HPI 60 year old man with a history of Barrett's esophagus here to discuss the need for continued treatment with reflux medications.  Those were started after he was diagnosed at [facility] he had an EGD in May 2021 with 5 cm of  Barrett's esophagus no dysplasia.  Due for follow-up in 2024.  He is asymptomatic with respect to reflux.  Current regimen of medications is pantoprazole 40 mg every other day, he does not take it daily because of his HIV medications and potential interaction he has to space these out, and is on famotidine 40 mg at bedtime.  He has recently started going to the healthy weight and weight loss center and is on an 1800-calorie restricted diet with increased proteins.  He is on an app counting calories and finds this tedious.  He is concerned about having to go every 2 weeks for visits.  His insulin level was 35.  He has had some success with weight loss so far.  He is interested in doing more on his own and not having frequent visits.  He has eliminated sodas and caloric beverages. Wt Readings from Last 3 Encounters:  07/17/21 267 lb 6 oz (121.3 kg)  07/03/21 267 lb (121.1 kg)  06/13/21 273 lb (123.8 kg)     Allergies  Allergen Reactions   Amoxicillin Rash   Current Meds  Medication Sig   amLODipine (NORVASC) 10 MG tablet TAKE 1 TABLET BY MOUTH EVERY DAY   atorvastatin (LIPITOR) 40 MG tablet Take 1 tablet (40 mg total) by mouth daily.   emtricitabine-rilpivir-tenofovir AF (ODEFSEY) 200-25-25 MG TABS tablet Take 1 tablet by mouth daily with breakfast.   fexofenadine (ALLEGRA) 180 MG tablet Take 180  mg by mouth daily.       tiZANidine (ZANAFLEX) 4 MG capsule Take 4 mg by mouth 3 (three) times daily.   Vitamin D, Cholecalciferol, 50 MCG (2000 UT) CAPS Take 1 capsule by mouth daily.   Famotidine Take 40 mg by mouth at bedtime.   [ pantoprazole (PROTONIX) 40 MG tablet Take 1 tablet (40 mg total) by mouth every other day.   [DISCONTINUED] tiZANidine (ZANAFLEX) 4 MG tablet Take 4 mg by mouth as needed.   Past Medical History:  Diagnosis Date   Allergy    seasonal   Arthritis    "hands" (12/04/2018)   Barrett's esophagus    Constipation    Edema of both lower extremities    GERD  (gastroesophageal reflux disease)    High cholesterol    HIV (human immunodeficiency virus infection) ([place]) dx'd 2002   Hypertension    IBS (irritable bowel syndrome)    Joint pain    Lower back pain    Personal history of colonic adenomas 03/10/2013   Pneumonia 1990s    from HIV   SOB (shortness of breath)    Vitamin D deficiency    Past Surgical History:  Procedure Laterality Date   BACK SURGERY     COLONOSCOPY     COLONOSCOPY W/ BIOPSIES AND POLYPECTOMY     ESOPHAGOGASTRODUODENOSCOPY     x2   LUMBAR LAMINECTOMY/DECOMPRESSION MICRODISCECTOMY  12/04/2018   L5-S1   LUMBAR LAMINECTOMY/DECOMPRESSION MICRODISCECTOMY N/A 12/04/2018   Procedure: Microlumbar decompression L5-S1;  Surgeon: Susa Day, MD;  Location: [place];  Service: Orthopedics;  Laterality: N/A;  120 mins   POLYPECTOMY     TONSILLECTOMY     UPPER GASTROINTESTINAL ENDOSCOPY     WISDOM TOOTH EXTRACTION  2009   Social History   Social History Narrative   Patient is married to Kaedan   Rare alcohol in the past essentially none now, never smoker never tobacco never drug user   family history includes Depression in his mother.   Review of Systems As per HPI  Objective:   Physical Exam BP 118/70 (BP Location: Left Arm, Patient Position: Sitting, Cuff Size: Normal)   Pulse 92   Ht '5\' 8"'$  (1.727 m) Comment: height measured without shoes  Wt 267 lb 6 oz (121.3 kg)   BMI 40.65 kg/m  Well-developed well-nourished obese white man no acute distress, he has significant abdominal obesity

## 2021-07-18 ENCOUNTER — Other Ambulatory Visit: Payer: Self-pay

## 2021-07-18 ENCOUNTER — Encounter (INDEPENDENT_AMBULATORY_CARE_PROVIDER_SITE_OTHER): Payer: Self-pay | Admitting: Bariatrics

## 2021-07-18 ENCOUNTER — Ambulatory Visit (INDEPENDENT_AMBULATORY_CARE_PROVIDER_SITE_OTHER): Payer: No Typology Code available for payment source | Admitting: Bariatrics

## 2021-07-18 VITALS — BP 135/81 | HR 72 | Temp 98.2°F | Ht 68.0 in | Wt 262.0 lb

## 2021-07-18 DIAGNOSIS — E559 Vitamin D deficiency, unspecified: Secondary | ICD-10-CM | POA: Diagnosis not present

## 2021-07-18 DIAGNOSIS — E786 Lipoprotein deficiency: Secondary | ICD-10-CM | POA: Diagnosis not present

## 2021-07-18 DIAGNOSIS — Z6841 Body Mass Index (BMI) 40.0 and over, adult: Secondary | ICD-10-CM

## 2021-07-18 DIAGNOSIS — I10 Essential (primary) hypertension: Secondary | ICD-10-CM

## 2021-07-18 DIAGNOSIS — Z9189 Other specified personal risk factors, not elsewhere classified: Secondary | ICD-10-CM | POA: Diagnosis not present

## 2021-07-19 ENCOUNTER — Encounter (INDEPENDENT_AMBULATORY_CARE_PROVIDER_SITE_OTHER): Payer: Self-pay | Admitting: Bariatrics

## 2021-07-19 DIAGNOSIS — E786 Lipoprotein deficiency: Secondary | ICD-10-CM | POA: Insufficient documentation

## 2021-07-19 LAB — COMPREHENSIVE METABOLIC PANEL
ALT: 49 IU/L — ABNORMAL HIGH (ref 0–44)
AST: 24 IU/L (ref 0–40)
Albumin/Globulin Ratio: 3.3 — ABNORMAL HIGH (ref 1.2–2.2)
Albumin: 5.2 g/dL — ABNORMAL HIGH (ref 3.8–4.9)
Alkaline Phosphatase: 100 IU/L (ref 44–121)
BUN/Creatinine Ratio: 14 (ref 9–20)
BUN: 13 mg/dL (ref 6–24)
Bilirubin Total: 0.6 mg/dL (ref 0.0–1.2)
CO2: 22 mmol/L (ref 20–29)
Calcium: 9.3 mg/dL (ref 8.7–10.2)
Chloride: 105 mmol/L (ref 96–106)
Creatinine, Ser: 0.96 mg/dL (ref 0.76–1.27)
Globulin, Total: 1.6 g/dL (ref 1.5–4.5)
Glucose: 86 mg/dL (ref 65–99)
Potassium: 4 mmol/L (ref 3.5–5.2)
Sodium: 143 mmol/L (ref 134–144)
Total Protein: 6.8 g/dL (ref 6.0–8.5)
eGFR: 91 mL/min/{1.73_m2} (ref >59)

## 2021-07-19 LAB — LIPID PANEL WITH LDL/HDL RATIO
Cholesterol, Total: 113 mg/dL (ref 100–199)
HDL: 31 mg/dL — ABNORMAL LOW (ref >39)
LDL Chol Calc (NIH): 64 mg/dL (ref 0–99)
LDL/HDL Ratio: 2.1 ratio (ref 0.0–3.6)
Triglycerides: 91 mg/dL (ref 0–149)
VLDL Cholesterol Cal: 18 mg/dL (ref 5–40)

## 2021-07-19 LAB — VITAMIN D 25 HYDROXY (VIT D DEFICIENCY, FRACTURES): Vit D, 25-Hydroxy: 60.6 ng/mL (ref 30.0–100.0)

## 2021-07-19 NOTE — Progress Notes (Signed)
Chief Complaint:   OBESITY Edward Hendricks is here to discuss his progress with his obesity treatment plan along with follow-up of his obesity related diagnoses. Edward Hendricks is on the Category 4 Plan and states he is following his eating plan approximately 100% of the time. Edward Hendricks states he is walking on treadmill for 25 minutes 3-4 times per week.  Today's visit was #: 4 Starting weight: 280 lbs Starting date: 05/30/2021 Today's weight: 262 lbs Today's date: 07/18/2021 Total lbs lost to date: 18 lbs Total lbs lost since last in-office visit: 5 lbs  Interim History: Edward Hendricks is down an additional 5 lbs since his last visit.  Subjective:   1. Vitamin D deficiency Edward Hendricks is taking Vitamin D.  2. Low HDL (under 40) Edward Hendricks is working on diet and weight loss. Edward Hendricks is taking Lipitor.  3. Essential hypertension Edward Hendricks is taking Norvasc.  4. At risk for heart disease Edward Hendricks is at risk for heart disease due to HDL (cholesterol) and obesity.  Assessment/Plan:   1. Vitamin D deficiency Low Vitamin D level contributes to fatigue and are associated with obesity, breast, and colon cancer. Edward Hendricks will continue to take prescription Vitamin D 50,000 IU every week and he will follow-up for routine testing of Vitamin D, at least 2-3 times per year to avoid over-replacement.  - VITAMIN D 25 Hydroxy (Vit-D Deficiency, Fractures)  2. Low HDL (under 40) Edward Hendricks will continue taking Lipitor. We will check labs today.   - Lipid Panel With LDL/HDL Ratio  3. Essential hypertension Edward Hendricks will continue taking Norvasc. He is working on healthy weight loss and exercise to improve blood pressure control. We will watch for signs of hypotension as he continues his lifestyle modifications.  - Comprehensive metabolic panel  4. At risk for heart disease Edward Hendricks was given approximately 15 minutes of coronary artery disease prevention counseling today. He is 60 y.o. male and has risk factors for heart disease  including obesity. We discussed intensive lifestyle modifications today with an emphasis on specific weight loss instructions and strategies.   Repetitive spaced learning was employed today to elicit superior memory formation and behavioral change.   5. Obesity, current BMI 39.9 Edward Hendricks is currently in the action stage of change. As such, his goal is to continue with weight loss efforts. He has agreed to the Category 4 Plan.   Edward Hendricks will continue meal planning. He will continue to adhere closely to the plan. He will portion out his meals.  Exercise goals:  Not as active.  Behavioral modification strategies: increasing lean protein intake, decreasing simple carbohydrates, increasing vegetables, increasing water intake, decreasing eating out, no skipping meals, meal planning and cooking strategies, keeping healthy foods in the home, and planning for success.  Edward Hendricks has agreed to follow-up with our clinic in 2 weeks. He was informed of the importance of frequent follow-up visits to maximize his success with intensive lifestyle modifications for his multiple health conditions.   Edward Hendricks was informed we would discuss his lab results at his next visit unless there is a critical issue that needs to be addressed sooner. Edward Hendricks agreed to keep his next visit at the agreed upon time to discuss these results.  Objective:   Blood pressure 135/81, pulse 72, temperature 98.2 F (36.8 C), height '5\' 8"'$  (1.727 m), weight 262 lb (118.8 kg), SpO2 94 %. Body mass index is 39.84 kg/m.  General: Cooperative, alert, well developed, in no acute distress. HEENT: Conjunctivae and lids unremarkable. Cardiovascular: Regular rhythm.  Lungs: Normal  work of breathing. Neurologic: No focal deficits.   Lab Results  Component Value Date   CREATININE 0.96 07/18/2021   BUN 13 07/18/2021   NA 143 07/18/2021   K 4.0 07/18/2021   CL 105 07/18/2021   CO2 22 07/18/2021   Lab Results  Component Value Date   ALT 49 (H)  07/18/2021   AST 24 07/18/2021   ALKPHOS 100 07/18/2021   BILITOT 0.6 07/18/2021   Lab Results  Component Value Date   HGBA1C 5.3 05/30/2021   HGBA1C 5.1 11/06/2019   Lab Results  Component Value Date   INSULIN 35.9 (H) 05/30/2021   Lab Results  Component Value Date   TSH 1.31 04/04/2021   Lab Results  Component Value Date   CHOL 113 07/18/2021   HDL 31 (L) 07/18/2021   LDLCALC 64 07/18/2021   TRIG 91 07/18/2021   CHOLHDL 3.9 03/23/2021   Lab Results  Component Value Date   VD25OH 60.6 07/18/2021   VD25OH 34.15 04/04/2021   VD25OH 56.29 11/06/2019   Lab Results  Component Value Date   WBC 6.3 03/23/2021   HGB 16.0 03/23/2021   HCT 46.8 03/23/2021   MCV 91.1 03/23/2021   PLT 211 03/23/2021   No results found for: IRON, TIBC, FERRITIN  Attestation Statements:   Reviewed by clinician on day of visit: allergies, medications, problem list, medical history, surgical history, family history, social history, and previous encounter notes.   I, Lizbeth Bark, RMA, am acting as Location manager for [employer], DO.   I have reviewed the above documentation for accuracy and completeness, and I agree with the above. Jearld Lesch, DO

## 2021-07-20 ENCOUNTER — Encounter (INDEPENDENT_AMBULATORY_CARE_PROVIDER_SITE_OTHER): Payer: Self-pay | Admitting: Bariatrics

## 2021-08-01 ENCOUNTER — Other Ambulatory Visit: Payer: Self-pay | Admitting: Internal Medicine

## 2021-08-01 DIAGNOSIS — E781 Pure hyperglyceridemia: Secondary | ICD-10-CM

## 2021-08-02 ENCOUNTER — Other Ambulatory Visit: Payer: Self-pay

## 2021-08-02 ENCOUNTER — Encounter (INDEPENDENT_AMBULATORY_CARE_PROVIDER_SITE_OTHER): Payer: Self-pay | Admitting: Bariatrics

## 2021-08-02 ENCOUNTER — Ambulatory Visit (INDEPENDENT_AMBULATORY_CARE_PROVIDER_SITE_OTHER): Payer: No Typology Code available for payment source | Admitting: Bariatrics

## 2021-08-02 VITALS — BP 137/87 | HR 88 | Temp 98.9°F | Ht 68.0 in | Wt 260.0 lb

## 2021-08-02 DIAGNOSIS — E7849 Other hyperlipidemia: Secondary | ICD-10-CM | POA: Diagnosis not present

## 2021-08-02 DIAGNOSIS — K5909 Other constipation: Secondary | ICD-10-CM

## 2021-08-02 DIAGNOSIS — E8881 Metabolic syndrome: Secondary | ICD-10-CM | POA: Diagnosis not present

## 2021-08-02 DIAGNOSIS — Z6841 Body Mass Index (BMI) 40.0 and over, adult: Secondary | ICD-10-CM

## 2021-08-03 NOTE — Progress Notes (Signed)
Chief Complaint:   OBESITY Kenji is here to discuss his progress with his obesity treatment plan along with follow-up of his obesity related diagnoses. Melton is on the Category 4 Plan and states he is following his eating plan approximately 100% of the time. Mansel states he is doing the treadmill for 20 minutes 3-4 times per week.  Today's visit was #: 5 Starting weight: 280 lbs Starting date: 05/30/2021 Today's weight: 260 lbs Today's date: 08/02/2021 Total lbs lost to date: 20 lbs Total lbs lost since last in-office visit: 2 lbs  Interim History: Sheldan is down an additional 2 lbs. He is doing the same and exercising. He is drinking adequate water. His body weight is up about 2 lbs.  Subjective:   1. Other hyperlipidemia Kapil is currently taking Lipitor.  2. Insulin resistance Javae is currently not on medications for Insulin resistance.  3. Other constipation Corrado is currently taking Miralax.  Assessment/Plan:   1. Other hyperlipidemia Cardiovascular risk and specific lipid/LDL goals reviewed.  We discussed several lifestyle modifications today and Kelvyn will continue to work on diet, exercise and weight loss efforts. Chaden will continue Lipitor. Orders and follow up as documented in patient record.   Counseling Intensive lifestyle modifications are the first line treatment for this issue. Dietary changes: Increase soluble fiber. Decrease simple carbohydrates. Exercise changes: Moderate to vigorous-intensity aerobic activity 150 minutes per week if tolerated. Lipid-lowering medications: see documented in medical record.   2. Insulin resistance Semaje will continue to decrease carbohydrates and increase healthy fats and protein. He will continue to work on weight loss, exercise, and decreasing simple carbohydrates to help decrease the risk of diabetes. Nichalas agreed to follow-up with [place] as directed to closely monitor his progress.   3. Other  constipation Knight was informed that a decrease in bowel movement frequency is normal while losing weight, but stools should not be hard or painful. Omar will increase H2O. He will continue Miralax. He will add a stool softener. He will add Fleet Suppositories OTC. Orders and follow up as documented in patient record.   Counseling Getting to Good Bowel Health: Your goal is to have one soft bowel movement each day. Drink at least 8 glasses of water each day. Eat plenty of fiber (goal is over 25 grams each day). It is best to get most of your fiber from dietary sources which includes leafy green vegetables, fresh fruit, and whole grains. You may need to add fiber with the help of OTC fiber supplements. These include Metamucil, Citrucel, and Flaxseed. If you are still having trouble, try adding Miralax or Magnesium Citrate. If all of these changes do not work, Cabin crew.   4. Obesity, current BMI 39.6 Doyel is currently in the action stage of change. As such, his goal is to continue with weight loss efforts. He has agreed to the Category 4 Plan.   Paxten will continue to adhere closely to the plan.  Exercise goals:  Zaul has back pain.  Behavioral modification strategies: increasing lean protein intake, decreasing simple carbohydrates, increasing vegetables, increasing water intake, decreasing eating out, no skipping meals, meal planning and cooking strategies, keeping healthy foods in the home, and planning for success.  Eason has agreed to follow-up with our clinic in 2 weeks. He was informed of the importance of frequent follow-up visits to maximize his success with intensive lifestyle modifications for his multiple health conditions.   Objective:   Blood pressure 137/87, pulse 88, temperature 98.9  F (37.2 C), height '5\' 8"'$  (1.727 m), weight 260 lb (117.9 kg), SpO2 94 %. Body mass index is 39.53 kg/m.  General: Cooperative, alert, well developed, in no acute  distress. HEENT: Conjunctivae and lids unremarkable. Cardiovascular: Regular rhythm.  Lungs: Normal work of breathing. Neurologic: No focal deficits.   Lab Results  Component Value Date   CREATININE 0.96 07/18/2021   BUN 13 07/18/2021   NA 143 07/18/2021   K 4.0 07/18/2021   CL 105 07/18/2021   CO2 22 07/18/2021   Lab Results  Component Value Date   ALT 49 (H) 07/18/2021   AST 24 07/18/2021   ALKPHOS 100 07/18/2021   BILITOT 0.6 07/18/2021   Lab Results  Component Value Date   HGBA1C 5.3 05/30/2021   HGBA1C 5.1 11/06/2019   Lab Results  Component Value Date   INSULIN 35.9 (H) 05/30/2021   Lab Results  Component Value Date   TSH 1.31 04/04/2021   Lab Results  Component Value Date   CHOL 113 07/18/2021   HDL 31 (L) 07/18/2021   LDLCALC 64 07/18/2021   TRIG 91 07/18/2021   CHOLHDL 3.9 03/23/2021   Lab Results  Component Value Date   VD25OH 60.6 07/18/2021   VD25OH 34.15 04/04/2021   VD25OH 56.29 11/06/2019   Lab Results  Component Value Date   WBC 6.3 03/23/2021   HGB 16.0 03/23/2021   HCT 46.8 03/23/2021   MCV 91.1 03/23/2021   PLT 211 03/23/2021   No results found for: IRON, TIBC, FERRITIN  Attestation Statements:   Reviewed by clinician on day of visit: allergies, medications, problem list, medical history, surgical history, family history, social history, and previous encounter notes.  Time spent on visit including pre-visit chart review and post-visit care and charting was 20 minutes.   I, Lizbeth Bark, RMA, am acting as Location manager for [employer], DO.   I have reviewed the above documentation for accuracy and completeness, and I agree with the above. Jearld Lesch, DO

## 2021-08-07 ENCOUNTER — Encounter (INDEPENDENT_AMBULATORY_CARE_PROVIDER_SITE_OTHER): Payer: Self-pay | Admitting: Bariatrics

## 2021-08-22 ENCOUNTER — Other Ambulatory Visit: Payer: Self-pay | Admitting: Infectious Diseases

## 2021-08-22 ENCOUNTER — Other Ambulatory Visit: Payer: Self-pay | Admitting: Internal Medicine

## 2021-08-22 DIAGNOSIS — I10 Essential (primary) hypertension: Secondary | ICD-10-CM

## 2021-08-28 ENCOUNTER — Ambulatory Visit (INDEPENDENT_AMBULATORY_CARE_PROVIDER_SITE_OTHER): Payer: No Typology Code available for payment source | Admitting: Bariatrics

## 2021-08-28 ENCOUNTER — Other Ambulatory Visit: Payer: Self-pay

## 2021-08-28 ENCOUNTER — Encounter (INDEPENDENT_AMBULATORY_CARE_PROVIDER_SITE_OTHER): Payer: Self-pay | Admitting: Bariatrics

## 2021-08-28 VITALS — BP 134/91 | HR 86 | Temp 98.1°F | Ht 68.0 in | Wt 251.0 lb

## 2021-08-28 DIAGNOSIS — Z6841 Body Mass Index (BMI) 40.0 and over, adult: Secondary | ICD-10-CM | POA: Diagnosis not present

## 2021-08-28 DIAGNOSIS — I10 Essential (primary) hypertension: Secondary | ICD-10-CM

## 2021-08-28 DIAGNOSIS — E8881 Metabolic syndrome: Secondary | ICD-10-CM | POA: Diagnosis not present

## 2021-08-28 NOTE — Progress Notes (Signed)
Chief Complaint:   OBESITY Edward Hendricks is here to discuss his progress with his obesity treatment plan along with follow-up of his obesity related diagnoses. Edward Hendricks is on the Category 4 Plan and states he is following his eating plan approximately 100% of the time. Edward Hendricks states he is doing the treadmill for 20 minutes 3-4 times per week.  Today's visit was #: 6 Starting weight: 280 lbs Starting date: 05/30/2021 Today's weight: 251 lbs Today's date: 08/28/2021 Total lbs lost to date: 29 lbs Total lbs lost since last in-office visit: 9 lbs  Interim History: Edward Hendricks is down another 9 lbs and is doing exceptionally well overall. He is making his own yogurt and yogurt icream. He states that he is eating a lot of lean meat and keeping his vegetable intake high.  Subjective:   1. Insulin resistance Edward Hendricks is not currently on medications.  2. Essential hypertension Edward Hendricks's diastolic has increased slightly.   Assessment/Plan:   1. Insulin resistance Edward Hendricks will continue to work on weight loss, exercise, and activities. He will decreasing simple carbohydrates to help decrease the risk of diabetes. Edward Hendricks agreed to follow-up with [place] as directed to closely monitor his progress.  2. Essential hypertension Edward Hendricks will continue his medications. He is working on healthy weight loss and exercise to improve blood pressure control. We will watch for signs of hypotension as he continues his lifestyle modifications.   3. Obesity, current BMI 38.2 Edward Hendricks is currently in the action stage of change. As such, his goal is to continue with weight loss efforts. He has agreed to the Category 4 Plan.   Edward Hendricks will continue meal planning. He will continue to adhere closely to the plan.  Exercise goals:  Edward Hendricks will do Boflex 10 minutes and will slowly increase weight keeping them low.  Behavioral modification strategies: increasing lean protein intake, decreasing simple carbohydrates, increasing  vegetables, increasing water intake, decreasing eating out, no skipping meals, meal planning and cooking strategies, keeping healthy foods in the home, and planning for success.  Edward Hendricks has agreed to follow-up with our clinic in 2-3 weeks. He was informed of the importance of frequent follow-up visits to maximize his success with intensive lifestyle modifications for his multiple health conditions.   Objective:   Blood pressure (!) 134/91, pulse 86, temperature 98.1 F (36.7 C), height '5\' 8"'$  (1.727 m), weight 251 lb (113.9 kg), SpO2 95 %. Body mass index is 38.16 kg/m.  General: Cooperative, alert, well developed, in no acute distress. HEENT: Conjunctivae and lids unremarkable. Cardiovascular: Regular rhythm.  Lungs: Normal work of breathing. Neurologic: No focal deficits.   Lab Results  Component Value Date   CREATININE 0.96 07/18/2021   BUN 13 07/18/2021   NA 143 07/18/2021   K 4.0 07/18/2021   CL 105 07/18/2021   CO2 22 07/18/2021   Lab Results  Component Value Date   ALT 49 (H) 07/18/2021   AST 24 07/18/2021   ALKPHOS 100 07/18/2021   BILITOT 0.6 07/18/2021   Lab Results  Component Value Date   HGBA1C 5.3 05/30/2021   HGBA1C 5.1 11/06/2019   Lab Results  Component Value Date   INSULIN 35.9 (H) 05/30/2021   Lab Results  Component Value Date   TSH 1.31 04/04/2021   Lab Results  Component Value Date   CHOL 113 07/18/2021   HDL 31 (L) 07/18/2021   LDLCALC 64 07/18/2021   TRIG 91 07/18/2021   CHOLHDL 3.9 03/23/2021   Lab Results  Component Value Date  VD25OH 60.6 07/18/2021   VD25OH 34.15 04/04/2021   VD25OH 56.29 11/06/2019   Lab Results  Component Value Date   WBC 6.3 03/23/2021   HGB 16.0 03/23/2021   HCT 46.8 03/23/2021   MCV 91.1 03/23/2021   PLT 211 03/23/2021   No results found for: IRON, TIBC, FERRITIN  Attestation Statements:   Reviewed by clinician on day of visit: allergies, medications, problem list, medical history, surgical  history, family history, social history, and previous encounter notes.  Time spent on visit including pre-visit chart review and post-visit care and charting was 20 minutes.   I, Lizbeth Bark, RMA, am acting as Location manager for [employer], DO.   I have reviewed the above documentation for accuracy and completeness, and I agree with the above. Jearld Lesch, DO

## 2021-08-29 ENCOUNTER — Encounter (INDEPENDENT_AMBULATORY_CARE_PROVIDER_SITE_OTHER): Payer: Self-pay | Admitting: Bariatrics

## 2021-09-18 ENCOUNTER — Encounter (INDEPENDENT_AMBULATORY_CARE_PROVIDER_SITE_OTHER): Payer: Self-pay | Admitting: Bariatrics

## 2021-09-18 ENCOUNTER — Ambulatory Visit (INDEPENDENT_AMBULATORY_CARE_PROVIDER_SITE_OTHER): Payer: No Typology Code available for payment source | Admitting: Bariatrics

## 2021-09-18 ENCOUNTER — Other Ambulatory Visit: Payer: Self-pay

## 2021-09-18 VITALS — BP 146/90 | HR 86 | Temp 98.2°F | Ht 68.0 in | Wt 251.0 lb

## 2021-09-18 DIAGNOSIS — I10 Essential (primary) hypertension: Secondary | ICD-10-CM

## 2021-09-18 DIAGNOSIS — E66813 Obesity, class 3: Secondary | ICD-10-CM

## 2021-09-18 DIAGNOSIS — E7849 Other hyperlipidemia: Secondary | ICD-10-CM | POA: Diagnosis not present

## 2021-09-18 DIAGNOSIS — K5909 Other constipation: Secondary | ICD-10-CM | POA: Diagnosis not present

## 2021-09-18 DIAGNOSIS — Z6841 Body Mass Index (BMI) 40.0 and over, adult: Secondary | ICD-10-CM

## 2021-09-18 NOTE — Progress Notes (Signed)
Chief Complaint:   OBESITY Edward Hendricks is here to discuss his progress with his obesity treatment plan along with follow-up of his obesity related diagnoses. Edward Hendricks is on the Category 4 Plan and states he is following his eating plan approximately 99% of the time. Edward Hendricks states he is using the treadmill for 20 minutes 3 times per week.  Today's visit was #: 7 Starting weight: 280 lbs Starting date: 05/30/2021 Today's weight: 251 lbs Today's date: 09/18/2021 Total lbs lost to date: 29 lbs Total lbs lost since last in-office visit: 0  Interim History: Edward Hendricks's weight remains the same. He did not feel like he was losing weight. He is not sleeping well.  Subjective:   1. Essential hypertension Edward Hendricks's blood pressure was elevated today. He is taking his medications as directed.  2. Other hyperlipidemia Edward Hendricks is currently taking Lipitor.  3. Other constipation Edward Hendricks notes constipation.   Assessment/Plan:   1. Essential hypertension Edward Hendricks is working on healthy weight loss and exercise to improve blood pressure control. Edward Hendricks will continue his medications. We will watch for signs of hypotension as he continues his lifestyle modifications.  2. Other hyperlipidemia Cardiovascular risk and specific lipid/LDL goals reviewed.  We discussed several lifestyle modifications today and Edward Hendricks will continue to work on diet, exercise and weight loss efforts. Edward Hendricks will continue medication. He will continue with zero trans fats and decreased saturated fats. Orders and follow up as documented in patient record.   Counseling Intensive lifestyle modifications are the first line treatment for this issue. Dietary changes: Increase soluble fiber. Decrease simple carbohydrates. Exercise changes: Moderate to vigorous-intensity aerobic activity 150 minutes per week if tolerated. Lipid-lowering medications: see documented in medical record.   3. Other constipation Edward Hendricks was informed that a  decrease in bowel movement frequency is normal while losing weight, but stools should not be hard or painful. Edward Hendricks will increase fiber and water. He will use Miralax in water or coffee in the morning.Orders and follow up as documented in patient record.   Counseling Getting to Good Bowel Health: Your goal is to have one soft bowel movement each day. Drink at least 8 glasses of water each day. Eat plenty of fiber (goal is over 25 grams each day). It is best to get most of your fiber from dietary sources which includes leafy green vegetables, fresh fruit, and whole grains. You may need to add fiber with the help of OTC fiber supplements. These include Metamucil, Citrucel, and Flaxseed. If you are still having trouble, try adding Miralax or Magnesium Citrate. If all of these changes do not work, Cabin crew.   4. Obesity, current BMI 38.3 Edward Hendricks is currently in the action stage of change. As such, his goal is to continue with weight loss efforts. He has agreed to the Category 4 Plan and keeping a food journal and adhering to recommended goals of 1600 calories and 80-90 grams of protein.   Edward Hendricks will continue meal planning and intentional eating. He will adhere closely to the plan.  Exercise goals:  Edward Hendricks will use Bowflex machine. He is using it less.   Behavioral modification strategies: increasing lean protein intake, decreasing simple carbohydrates, increasing vegetables, increasing water intake, decreasing eating out, no skipping meals, meal planning and cooking strategies, keeping healthy foods in the home, and planning for success.  Edward Hendricks has agreed to follow-up with our clinic in 3 weeks with fasting IC. He was informed of the importance of frequent follow-up visits to maximize his success  with intensive lifestyle modifications for his multiple health conditions.   Objective:   Blood pressure (!) 146/90, pulse 86, temperature 98.2 F (36.8 C), height 5\' 8"  (1.727 m), weight 251  lb (113.9 kg), SpO2 96 %. Body mass index is 38.16 kg/m.  General: Cooperative, alert, well developed, in no acute distress. HEENT: Conjunctivae and lids unremarkable. Cardiovascular: Regular rhythm.  Lungs: Normal work of breathing. Neurologic: No focal deficits.   Lab Results  Component Value Date   CREATININE 0.96 07/18/2021   BUN 13 07/18/2021   NA 143 07/18/2021   K 4.0 07/18/2021   CL 105 07/18/2021   CO2 22 07/18/2021   Lab Results  Component Value Date   ALT 49 (H) 07/18/2021   AST 24 07/18/2021   ALKPHOS 100 07/18/2021   BILITOT 0.6 07/18/2021   Lab Results  Component Value Date   HGBA1C 5.3 05/30/2021   HGBA1C 5.1 11/06/2019   Lab Results  Component Value Date   INSULIN 35.9 (H) 05/30/2021   Lab Results  Component Value Date   TSH 1.31 04/04/2021   Lab Results  Component Value Date   CHOL 113 07/18/2021   HDL 31 (L) 07/18/2021   LDLCALC 64 07/18/2021   TRIG 91 07/18/2021   CHOLHDL 3.9 03/23/2021   Lab Results  Component Value Date   VD25OH 60.6 07/18/2021   VD25OH 34.15 04/04/2021   VD25OH 56.29 11/06/2019   Lab Results  Component Value Date   WBC 6.3 03/23/2021   HGB 16.0 03/23/2021   HCT 46.8 03/23/2021   MCV 91.1 03/23/2021   PLT 211 03/23/2021   No results found for: IRON, TIBC, FERRITIN  Attestation Statements:   Reviewed by clinician on day of visit: allergies, medications, problem list, medical history, surgical history, family history, social history, and previous encounter notes.  I, Lizbeth Bark, RMA, am acting as Location manager for [employer], DO.   I have reviewed the above documentation for accuracy and completeness, and I agree with the above. Jearld Lesch, DO

## 2021-09-19 ENCOUNTER — Other Ambulatory Visit: Payer: Self-pay | Admitting: Internal Medicine

## 2021-09-19 DIAGNOSIS — I10 Essential (primary) hypertension: Secondary | ICD-10-CM

## 2021-10-10 ENCOUNTER — Encounter (INDEPENDENT_AMBULATORY_CARE_PROVIDER_SITE_OTHER): Payer: Self-pay | Admitting: Bariatrics

## 2021-10-10 ENCOUNTER — Ambulatory Visit (INDEPENDENT_AMBULATORY_CARE_PROVIDER_SITE_OTHER): Payer: No Typology Code available for payment source | Admitting: Bariatrics

## 2021-10-10 ENCOUNTER — Other Ambulatory Visit: Payer: Self-pay

## 2021-10-10 VITALS — BP 143/87 | HR 89 | Temp 98.0°F | Ht 68.0 in | Wt 248.0 lb

## 2021-10-10 DIAGNOSIS — E8881 Metabolic syndrome: Secondary | ICD-10-CM | POA: Diagnosis not present

## 2021-10-10 DIAGNOSIS — Z6841 Body Mass Index (BMI) 40.0 and over, adult: Secondary | ICD-10-CM

## 2021-10-10 DIAGNOSIS — R0602 Shortness of breath: Secondary | ICD-10-CM

## 2021-10-10 DIAGNOSIS — E7849 Other hyperlipidemia: Secondary | ICD-10-CM

## 2021-10-10 DIAGNOSIS — G4709 Other insomnia: Secondary | ICD-10-CM

## 2021-10-10 DIAGNOSIS — R5383 Other fatigue: Secondary | ICD-10-CM

## 2021-10-10 MED ORDER — ZALEPLON 5 MG PO CAPS: 5.0000 mg | ORAL_CAPSULE | Freq: Every evening | ORAL | 0 refills | Status: DC | PRN

## 2021-10-10 NOTE — Progress Notes (Signed)
Chief Complaint:   OBESITY Edward Hendricks is here to discuss his progress with his obesity treatment plan along with follow-up of his obesity related diagnoses. Edward Hendricks is on the Category 4 Plan or keeping a food journal and adhering to recommended goals of 1600 calories and 80-90 grams of protein daily and states he is following his eating plan approximately 95% of the time. Edward Hendricks states he is on the treadmill for 30 minutes 2 times per week.  Today's visit was #: 8 Starting weight: 280 lbs Starting date: 05/30/2021 Today's weight: 248 lbs Today's date: 10/10/2021 Total lbs lost to date: 32 Total lbs lost since last in-office visit: 3  Interim History: Edward Hendricks is down an additional 3 lbs and he is doing well overall. He is having sleep difficulty (stay asleep).  Subjective:   1. Insulin resistance Edward Hendricks is not currently on medications.  2. Other hyperlipidemia Edward Hendricks is currently taking Lipitor.  3. Other fatigue Edward Hendricks's last RMR in June 2022 was 2877, and his repeat IC shows his RMR at 2750 today.  4. SOB (shortness of breath) on exertion Edward Hendricks's last RMR in June 2022 was 2877, and his repeat IC shows his RMR at 2750 today.  5. Other insomnia Edward Hendricks notes he has difficulty staying asleep.  Assessment/Plan:   1. Insulin resistance Edward Hendricks will continue to work on weight loss, exercise, and decreasing simple carbohydrates to help decrease the risk of diabetes. We will check labs today. Edward Hendricks agreed to follow-up with [place] as directed to closely monitor his progress.  - Insulin, random - Hemoglobin A1c  2. Other hyperlipidemia Cardiovascular risk and specific lipid/LDL goals reviewed. We discussed several lifestyle modifications today. We will check labs today. Edward Hendricks will continue to work on diet, exercise and weight loss efforts. Orders and follow up as documented in patient record.   Counseling Intensive lifestyle modifications are the first line treatment for this  issue. Dietary changes: Increase soluble fiber. Decrease simple carbohydrates. Exercise changes: Moderate to vigorous-intensity aerobic activity 150 minutes per week if tolerated. Lipid-lowering medications: see documented in medical record.  - Comprehensive metabolic panel  3. Other fatigue We discussed IC test, findings and implications. We will check labs today. Takuya will focus on self care including making healthy food choices, increasing physical activity and focusing on stress reduction.  - Comprehensive metabolic panel  4. SOB (shortness of breath) on exertion Edward Hendricks has agreed to work on weight loss and gradually increase exercise to treat his exercise induced shortness of breath. Will continue to monitor closely.  5. Other insomnia The problem of recurrent insomnia was discussed. Orders and follow up as documented in patient record. Counseling: Intensive lifestyle modifications are the first line treatment for this issue. We discussed several lifestyle modifications today. Edward Hendricks agreed to start Sonata 5 mg qhs with no refills. He will continue to work on diet, exercise and weight loss efforts.   Counseling Limit or avoid alcohol, caffeinated beverages, and cigarettes, especially close to bedtime.  Do not eat a large meal or eat spicy foods right before bedtime. This can lead to digestive discomfort that can make it hard for you to sleep. Keep a sleep diary to help you and your health care provider figure out what could be causing your insomnia.  Make your bedroom a dark, comfortable place where it is easy to fall asleep. Put up shades or blackout curtains to block light from outside. Use a white noise machine to block noise. Keep the temperature cool. Limit screen  use before bedtime. This includes: Watching TV. Using your smartphone, tablet, or computer. Stick to a routine that includes going to bed and waking up at the same times every day and night. This can help you fall  asleep faster. Consider making a quiet activity, such as reading, part of your nighttime routine. Try to avoid taking naps during the day so that you sleep better at night. Get out of bed if you are still awake after 15 minutes of trying to sleep. Keep the lights down, but try reading or doing a quiet activity. When you feel sleepy, go back to bed.  - zaleplon (SONATA) 5 MG capsule; Take 1 capsule (5 mg total) by mouth at bedtime as needed for sleep.  Dispense: 30 capsule; Refill: 0  6. Obesity, current BMI 37.7 Edward Hendricks is currently in the action stage of change. As such, his goal is to continue with weight loss efforts. He has agreed to the Category 4 Plan.   Intentional eating was discussed.  Exercise goals: As is.  Behavioral modification strategies: increasing lean protein intake, decreasing simple carbohydrates, increasing vegetables, increasing water intake, decreasing eating out, no skipping meals, meal planning and cooking strategies, keeping healthy foods in the home, and planning for success.  Edward Hendricks has agreed to follow-up with our clinic in 3 weeks. He was informed of the importance of frequent follow-up visits to maximize his success with intensive lifestyle modifications for his multiple health conditions.   Edward Hendricks was informed we would discuss his lab results at his next visit unless there is a critical issue that needs to be addressed sooner. Edward Hendricks agreed to keep his next visit at the agreed upon time to discuss these results.  Objective:   Blood pressure (!) 143/87, pulse 89, temperature 98 F (36.7 C), height 5\' 8"  (1.727 m), weight 248 lb (112.5 kg), SpO2 98 %. Body mass index is 37.71 kg/m.  General: Cooperative, alert, well developed, in no acute distress. HEENT: Conjunctivae and lids unremarkable. Cardiovascular: Regular rhythm.  Lungs: Normal work of breathing. Neurologic: No focal deficits.   Lab Results  Component Value Date   CREATININE 0.96 07/18/2021    BUN 13 07/18/2021   NA 143 07/18/2021   K 4.0 07/18/2021   CL 105 07/18/2021   CO2 22 07/18/2021   Lab Results  Component Value Date   ALT 49 (H) 07/18/2021   AST 24 07/18/2021   ALKPHOS 100 07/18/2021   BILITOT 0.6 07/18/2021   Lab Results  Component Value Date   HGBA1C 5.3 05/30/2021   HGBA1C 5.1 11/06/2019   Lab Results  Component Value Date   INSULIN 35.9 (H) 05/30/2021   Lab Results  Component Value Date   TSH 1.31 04/04/2021   Lab Results  Component Value Date   CHOL 113 07/18/2021   HDL 31 (L) 07/18/2021   LDLCALC 64 07/18/2021   TRIG 91 07/18/2021   CHOLHDL 3.9 03/23/2021   Lab Results  Component Value Date   VD25OH 60.6 07/18/2021   VD25OH 34.15 04/04/2021   VD25OH 56.29 11/06/2019   Lab Results  Component Value Date   WBC 6.3 03/23/2021   HGB 16.0 03/23/2021   HCT 46.8 03/23/2021   MCV 91.1 03/23/2021   PLT 211 03/23/2021   No results found for: IRON, TIBC, FERRITIN  Attestation Statements:   Reviewed by clinician on day of visit: allergies, medications, problem list, medical history, surgical history, family history, social history, and previous encounter notes.  Time spent on visit including  pre-visit chart review and post-visit care and charting was 30 minutes.    Wilhemena Durie, am acting as Location manager for [employer], DO.  I have reviewed the above documentation for accuracy and completeness, and I agree with the above. Jearld Lesch, DO

## 2021-10-11 LAB — COMPREHENSIVE METABOLIC PANEL
ALT: 28 IU/L (ref 0–44)
AST: 18 IU/L (ref 0–40)
Albumin/Globulin Ratio: 2.5 — ABNORMAL HIGH (ref 1.2–2.2)
Albumin: 5 g/dL — ABNORMAL HIGH (ref 3.8–4.9)
Alkaline Phosphatase: 107 IU/L (ref 44–121)
BUN/Creatinine Ratio: 17 (ref 9–20)
BUN: 17 mg/dL (ref 6–24)
Bilirubin Total: 0.5 mg/dL (ref 0.0–1.2)
CO2: 21 mmol/L (ref 20–29)
Calcium: 9.2 mg/dL (ref 8.7–10.2)
Chloride: 106 mmol/L (ref 96–106)
Creatinine, Ser: 1.01 mg/dL (ref 0.76–1.27)
Globulin, Total: 2 g/dL (ref 1.5–4.5)
Glucose: 93 mg/dL (ref 70–99)
Potassium: 4 mmol/L (ref 3.5–5.2)
Sodium: 143 mmol/L (ref 134–144)
Total Protein: 7 g/dL (ref 6.0–8.5)
eGFR: 86 mL/min/{1.73_m2} (ref >59)

## 2021-10-11 LAB — HEMOGLOBIN A1C
Est. average glucose Bld gHb Est-mCnc: 100 mg/dL
Hgb A1c MFr Bld: 5.1 % (ref 4.8–5.6)

## 2021-10-11 LAB — INSULIN, RANDOM: INSULIN: 22 u[IU]/mL (ref 2.6–24.9)

## 2021-10-12 ENCOUNTER — Encounter (INDEPENDENT_AMBULATORY_CARE_PROVIDER_SITE_OTHER): Payer: Self-pay | Admitting: Bariatrics

## 2021-10-20 ENCOUNTER — Other Ambulatory Visit: Payer: Self-pay | Admitting: Internal Medicine

## 2021-10-20 DIAGNOSIS — I10 Essential (primary) hypertension: Secondary | ICD-10-CM

## 2021-10-31 ENCOUNTER — Encounter (INDEPENDENT_AMBULATORY_CARE_PROVIDER_SITE_OTHER): Payer: Self-pay | Admitting: Bariatrics

## 2021-10-31 ENCOUNTER — Other Ambulatory Visit: Payer: Self-pay

## 2021-10-31 ENCOUNTER — Ambulatory Visit (INDEPENDENT_AMBULATORY_CARE_PROVIDER_SITE_OTHER): Payer: No Typology Code available for payment source | Admitting: Bariatrics

## 2021-10-31 VITALS — BP 140/86 | HR 86 | Temp 97.7°F | Ht 68.0 in | Wt 251.0 lb

## 2021-10-31 DIAGNOSIS — E8881 Metabolic syndrome: Secondary | ICD-10-CM | POA: Diagnosis not present

## 2021-10-31 DIAGNOSIS — Z6841 Body Mass Index (BMI) 40.0 and over, adult: Secondary | ICD-10-CM | POA: Diagnosis not present

## 2021-10-31 DIAGNOSIS — G4709 Other insomnia: Secondary | ICD-10-CM | POA: Diagnosis not present

## 2021-10-31 NOTE — Progress Notes (Signed)
Chief Complaint:   OBESITY Edward Hendricks is here to discuss his progress with his obesity treatment plan along with follow-up of his obesity related diagnoses. Edward Hendricks is on the Category 4 Plan and states he is following his eating plan approximately 95% of the time. Edward Hendricks states he is using the treadmill for 20 minutes 3 times per week.  Today's visit was #: 9 Starting weight: 280 lbs Starting date: 05/30/2021 Today's weight: 251 lbs Today's date: 10/31/2021 Total lbs lost to date: 29 lbs Total lbs lost since last in-office visit: 0  Interim History: Edward Hendricks is up 3 lbs since his last visit. He is up 6 lbs of water per the bioimpedance scale.  Subjective:   1. Insulin resistance Edward Hendricks is not on medications currently. He notes hunger.  2. Other insomnia Edward Hendricks is currently taking Sonata. He notes headaches with Sonata.  Assessment/Plan:   1. Insulin resistance Edward Hendricks will continue to minimize starches and sweets. He will continue activities. He will consider Apple Cider 2 tbsp prior to every meal. work on weight loss, exercise, and decreasing simple carbohydrates to help decrease the risk of diabetes. Edward Hendricks agreed to follow-up with [place] as directed to closely monitor his progress.  2. Other insomnia The problem of recurrent insomnia was discussed. Orders and follow up as documented in patient record. Counseling: Intensive lifestyle modifications are the first line treatment for this issue. Edward Hendricks will review sleep hygiene  He will discontinue Sonata. We discussed other possibilities for possible sleep aids in the future. . We discussed several lifestyle modifications today and he will continue to work on diet, exercise and weight loss efforts.   Counseling Limit or avoid alcohol, caffeinated beverages, and cigarettes, especially close to bedtime.  Do not eat a large meal or eat spicy foods right before bedtime. This can lead to digestive discomfort that can make it hard for you to  sleep. Keep a sleep diary to help you and your health care provider figure out what could be causing your insomnia.  Make your bedroom a dark, comfortable place where it is easy to fall asleep. Put up shades or blackout curtains to block light from outside. Use a white noise machine to block noise. Keep the temperature cool. Limit screen use before bedtime. This includes: Watching TV. Using your smartphone, tablet, or computer. Stick to a routine that includes going to bed and waking up at the same times every day and night. This can help you fall asleep faster. Consider making a quiet activity, such as reading, part of your nighttime routine. Try to avoid taking naps during the day so that you sleep better at night. Get out of bed if you are still awake after 15 minutes of trying to sleep. Keep the lights down, but try reading or doing a quiet activity. When you feel sleepy, go back to bed.    3. Obesity, current BMI 38.2 Edward Hendricks is currently in the action stage of change. As such, his goal is to continue with weight loss efforts. He has agreed to the Category 4 Plan.   Edward Hendricks will continue meal planning and he will continue intentional eating. He will increase water to 80 ounces.   Exercise goals:  Edward Hendricks will continue using the treadmill 3 times per week. He will consider Yoga, hand weights and strength.  Behavioral modification strategies: increasing lean protein intake, decreasing simple carbohydrates, increasing vegetables, increasing water intake, decreasing eating out, no skipping meals, meal planning and cooking strategies, keeping healthy foods  in the home, and planning for success.  Edward Hendricks has agreed to follow-up with our clinic in 2 weeks. He was informed of the importance of frequent follow-up visits to maximize his success with intensive lifestyle modifications for his multiple health conditions.   Objective:   Blood pressure 140/86, pulse 86, temperature 97.7 F (36.5 C),  height 5\' 8"  (1.727 m), weight 251 lb (113.9 kg), SpO2 98 %. Body mass index is 38.16 kg/m.  General: Cooperative, alert, well developed, in no acute distress. HEENT: Conjunctivae and lids unremarkable. Cardiovascular: Regular rhythm.  Lungs: Normal work of breathing. Neurologic: No focal deficits.   Lab Results  Component Value Date   CREATININE 1.01 10/10/2021   BUN 17 10/10/2021   NA 143 10/10/2021   K 4.0 10/10/2021   CL 106 10/10/2021   CO2 21 10/10/2021   Lab Results  Component Value Date   ALT 28 10/10/2021   AST 18 10/10/2021   ALKPHOS 107 10/10/2021   BILITOT 0.5 10/10/2021   Lab Results  Component Value Date   HGBA1C 5.1 10/10/2021   HGBA1C 5.3 05/30/2021   HGBA1C 5.1 11/06/2019   Lab Results  Component Value Date   INSULIN 22.0 10/10/2021   INSULIN 35.9 (H) 05/30/2021   Lab Results  Component Value Date   TSH 1.31 04/04/2021   Lab Results  Component Value Date   CHOL 113 07/18/2021   HDL 31 (L) 07/18/2021   LDLCALC 64 07/18/2021   TRIG 91 07/18/2021   CHOLHDL 3.9 03/23/2021   Lab Results  Component Value Date   VD25OH 60.6 07/18/2021   VD25OH 34.15 04/04/2021   VD25OH 56.29 11/06/2019   Lab Results  Component Value Date   WBC 6.3 03/23/2021   HGB 16.0 03/23/2021   HCT 46.8 03/23/2021   MCV 91.1 03/23/2021   PLT 211 03/23/2021   No results found for: IRON, TIBC, FERRITIN  Attestation Statements:   Reviewed by clinician on day of visit: allergies, medications, problem list, medical history, surgical history, family history, social history, and previous encounter notes.  I, Lizbeth Bark, RMA, am acting as Location manager for [employer], DO.   I have reviewed the above documentation for accuracy and completeness, and I agree with the above. Jearld Lesch, DO

## 2021-11-01 ENCOUNTER — Encounter (INDEPENDENT_AMBULATORY_CARE_PROVIDER_SITE_OTHER): Payer: Self-pay | Admitting: Bariatrics

## 2021-11-20 ENCOUNTER — Other Ambulatory Visit: Payer: Self-pay | Admitting: Internal Medicine

## 2021-11-20 DIAGNOSIS — I10 Essential (primary) hypertension: Secondary | ICD-10-CM

## 2021-11-28 ENCOUNTER — Other Ambulatory Visit: Payer: Self-pay | Admitting: Internal Medicine

## 2021-11-28 DIAGNOSIS — I10 Essential (primary) hypertension: Secondary | ICD-10-CM

## 2021-11-29 MED ORDER — AMLODIPINE BESYLATE 10 MG PO TABS: 10.0000 mg | ORAL_TABLET | Freq: Every day | ORAL | 0 refills | Status: DC

## 2021-12-04 ENCOUNTER — Encounter (INDEPENDENT_AMBULATORY_CARE_PROVIDER_SITE_OTHER): Payer: Self-pay | Admitting: Bariatrics

## 2021-12-04 ENCOUNTER — Other Ambulatory Visit: Payer: Self-pay

## 2021-12-04 ENCOUNTER — Ambulatory Visit (INDEPENDENT_AMBULATORY_CARE_PROVIDER_SITE_OTHER): Payer: No Typology Code available for payment source | Admitting: Bariatrics

## 2021-12-04 VITALS — BP 149/81 | HR 82 | Temp 97.7°F | Ht 68.0 in | Wt 248.0 lb

## 2021-12-04 DIAGNOSIS — E559 Vitamin D deficiency, unspecified: Secondary | ICD-10-CM | POA: Insufficient documentation

## 2021-12-04 DIAGNOSIS — E7849 Other hyperlipidemia: Secondary | ICD-10-CM | POA: Diagnosis not present

## 2021-12-04 DIAGNOSIS — E8881 Metabolic syndrome: Secondary | ICD-10-CM

## 2021-12-04 DIAGNOSIS — E88819 Insulin resistance, unspecified: Secondary | ICD-10-CM | POA: Insufficient documentation

## 2021-12-04 MED ORDER — METFORMIN HCL 500 MG PO TABS: 500.0000 mg | ORAL_TABLET | Freq: Every day | ORAL | 0 refills | Status: DC

## 2021-12-04 NOTE — Progress Notes (Signed)
Chief Complaint:   OBESITY Edward Hendricks is here to discuss his progress with his obesity treatment plan along with follow-up of his obesity related diagnoses. Edward Hendricks is on the Category 4 Plan and states he is following his eating plan approximately 85% of the time. Edward Hendricks states he is using the treadmill for 20 minutes 3 times per week.  Today's visit was #: 10 Starting weight: 280 lbs Starting date: 05/30/2021 Today's weight: 248 lbs Today's date: 12/04/2021 Total lbs lost to date: 32 lbs Total lbs lost since last in-office visit: 3 lbs  Interim History: Edward Hendricks is down 3 lbs and doing well overall.  Subjective:   1. Other hyperlipidemia Edward Hendricks is currently talking Lipitor.  2. Vitamin D deficiency Edward Hendricks is taking Vitamin D currently.  3. Insulin resistance Edward Hendricks notes occasional polyphagia.   Assessment/Plan:   1. Other hyperlipidemia Cardiovascular risk and specific lipid/LDL goals reviewed.  Edward Hendricks will continue taking Lipitor. We discussed several lifestyle modifications today and Edward Hendricks will continue to work on diet, exercise and weight loss efforts. Orders and follow up as documented in patient record.   Counseling Intensive lifestyle modifications are the first line treatment for this issue. Dietary changes: Increase soluble fiber. Decrease simple carbohydrates. Exercise changes: Moderate to vigorous-intensity aerobic activity 150 minutes per week if tolerated. Lipid-lowering medications: see documented in medical record.  2. Vitamin D deficiency Low Vitamin D level contributes to fatigue and are associated with obesity, breast, and colon cancer. Edward Hendricks agrees to continue to take prescription Vitamin D 2,000 untis daily and he will follow-up for routine testing of Vitamin D, at least 2-3 times per year to avoid over-replacement.  3. Insulin resistance Edward Hendricks will continue to work on weight loss, exercise, and decreasing simple carbohydrates to help decrease the risk  of diabetes. We will refill Metformin 500 mg daily with lunch for 1 month with no refills. Edward Hendricks agreed to follow-up with [place] as directed to closely monitor his progress. Metformin handout was provided.  - metFORMIN (GLUCOPHAGE) 500 MG tablet; Take 1 tablet (500 mg total) by mouth daily with lunch.  Dispense: 30 tablet; Refill: 0  4. Obesity, current BMI 37.8 Edward Hendricks is currently in the action stage of change. As such, his goal is to continue with weight loss efforts. He has agreed to the Category 4 Plan.   Edward Hendricks will continue meal planning and he will continue intentional eating.  Exercise goals:  As is.  Behavioral modification strategies: increasing lean protein intake, decreasing simple carbohydrates, increasing vegetables, increasing water intake, decreasing eating out, no skipping meals, meal planning and cooking strategies, keeping healthy foods in the home, and planning for success.  Edward Hendricks has agreed to follow-up with our clinic in 3 weeks. He was informed of the importance of frequent follow-up visits to maximize his success with intensive lifestyle modifications for his multiple health conditions.   Objective:   Blood pressure (!) 149/81, pulse 82, temperature 97.7 F (36.5 C), height 5\' 8"  (1.727 m), weight 248 lb (112.5 kg), SpO2 96 %. Body mass index is 37.71 kg/m.  General: Cooperative, alert, well developed, in no acute distress. HEENT: Conjunctivae and lids unremarkable. Cardiovascular: Regular rhythm.  Lungs: Normal work of breathing. Neurologic: No focal deficits.   Lab Results  Component Value Date   CREATININE 1.01 10/10/2021   BUN 17 10/10/2021   NA 143 10/10/2021   K 4.0 10/10/2021   CL 106 10/10/2021   CO2 21 10/10/2021   Lab Results  Component Value Date  ALT 28 10/10/2021   AST 18 10/10/2021   ALKPHOS 107 10/10/2021   BILITOT 0.5 10/10/2021   Lab Results  Component Value Date   HGBA1C 5.1 10/10/2021   HGBA1C 5.3 05/30/2021   HGBA1C 5.1  11/06/2019   Lab Results  Component Value Date   INSULIN 22.0 10/10/2021   INSULIN 35.9 (H) 05/30/2021   Lab Results  Component Value Date   TSH 1.31 04/04/2021   Lab Results  Component Value Date   CHOL 113 07/18/2021   HDL 31 (L) 07/18/2021   LDLCALC 64 07/18/2021   TRIG 91 07/18/2021   CHOLHDL 3.9 03/23/2021   Lab Results  Component Value Date   VD25OH 60.6 07/18/2021   VD25OH 34.15 04/04/2021   VD25OH 56.29 11/06/2019   Lab Results  Component Value Date   WBC 6.3 03/23/2021   HGB 16.0 03/23/2021   HCT 46.8 03/23/2021   MCV 91.1 03/23/2021   PLT 211 03/23/2021   No results found for: IRON, TIBC, FERRITIN  Attestation Statements:   Reviewed by clinician on day of visit: allergies, medications, problem list, medical history, surgical history, family history, social history, and previous encounter notes.  I, Lizbeth Bark, RMA, am acting as Location manager for [employer], DO.  I have reviewed the above documentation for accuracy and completeness, and I agree with the above. Jearld Lesch, DO

## 2021-12-05 ENCOUNTER — Encounter (INDEPENDENT_AMBULATORY_CARE_PROVIDER_SITE_OTHER): Payer: Self-pay | Admitting: Bariatrics

## 2021-12-19 ENCOUNTER — Other Ambulatory Visit: Payer: Self-pay | Admitting: Internal Medicine

## 2021-12-19 DIAGNOSIS — I10 Essential (primary) hypertension: Secondary | ICD-10-CM

## 2021-12-20 MED ORDER — AMLODIPINE BESYLATE 10 MG PO TABS: 10.0000 mg | ORAL_TABLET | Freq: Every day | ORAL | 0 refills | Status: DC

## 2021-12-21 ENCOUNTER — Other Ambulatory Visit: Payer: Self-pay

## 2021-12-21 DIAGNOSIS — Z113 Encounter for screening for infections with a predominantly sexual mode of transmission: Secondary | ICD-10-CM

## 2021-12-21 DIAGNOSIS — B20 Human immunodeficiency virus [HIV] disease: Secondary | ICD-10-CM

## 2021-12-27 ENCOUNTER — Other Ambulatory Visit (INDEPENDENT_AMBULATORY_CARE_PROVIDER_SITE_OTHER): Payer: Self-pay | Admitting: Bariatrics

## 2021-12-27 DIAGNOSIS — E8881 Metabolic syndrome: Secondary | ICD-10-CM

## 2021-12-28 ENCOUNTER — Other Ambulatory Visit: Payer: Self-pay

## 2021-12-28 ENCOUNTER — Encounter (INDEPENDENT_AMBULATORY_CARE_PROVIDER_SITE_OTHER): Payer: Self-pay | Admitting: Bariatrics

## 2021-12-28 ENCOUNTER — Ambulatory Visit (INDEPENDENT_AMBULATORY_CARE_PROVIDER_SITE_OTHER): Payer: No Typology Code available for payment source | Admitting: Bariatrics

## 2021-12-28 ENCOUNTER — Other Ambulatory Visit: Payer: No Typology Code available for payment source

## 2021-12-28 VITALS — BP 127/82 | HR 87 | Temp 98.0°F | Ht 68.0 in | Wt 248.0 lb

## 2021-12-28 DIAGNOSIS — E8881 Metabolic syndrome: Secondary | ICD-10-CM | POA: Diagnosis not present

## 2021-12-28 DIAGNOSIS — Z6837 Body mass index (BMI) 37.0-37.9, adult: Secondary | ICD-10-CM

## 2021-12-28 DIAGNOSIS — I10 Essential (primary) hypertension: Secondary | ICD-10-CM

## 2021-12-28 DIAGNOSIS — Z113 Encounter for screening for infections with a predominantly sexual mode of transmission: Secondary | ICD-10-CM

## 2021-12-28 DIAGNOSIS — B20 Human immunodeficiency virus [HIV] disease: Secondary | ICD-10-CM

## 2021-12-28 MED ORDER — METFORMIN HCL 500 MG PO TABS: 500.0000 mg | ORAL_TABLET | Freq: Every day | ORAL | 0 refills | Status: DC

## 2021-12-29 LAB — T-HELPER CELL (CD4) - (RCID CLINIC ONLY)
CD4 % Helper T Cell: 35 % (ref 33–65)
CD4 T Cell Abs: 555 /uL (ref 400–1790)

## 2021-12-30 LAB — CBC WITH DIFFERENTIAL/PLATELET
Absolute Monocytes: 342 cells/uL (ref 200–950)
Basophils Absolute: 20 cells/uL (ref 0–200)
Basophils Relative: 0.4 %
Eosinophils Absolute: 41 cells/uL (ref 15–500)
Eosinophils Relative: 0.8 %
HCT: 45.7 % (ref 38.5–50.0)
Hemoglobin: 15.6 g/dL (ref 13.2–17.1)
Lymphs Abs: 1576 cells/uL (ref 850–3900)
MCH: 31.2 pg (ref 27.0–33.0)
MCHC: 34.1 g/dL (ref 32.0–36.0)
MCV: 91.4 fL (ref 80.0–100.0)
MPV: 11 fL (ref 7.5–12.5)
Monocytes Relative: 6.7 %
Neutro Abs: 3121 cells/uL (ref 1500–7800)
Neutrophils Relative %: 61.2 %
Platelets: 165 10*3/uL (ref 140–400)
RBC: 5 10*6/uL (ref 4.20–5.80)
RDW: 13 % (ref 11.0–15.0)
Total Lymphocyte: 30.9 %
WBC: 5.1 10*3/uL (ref 3.8–10.8)

## 2021-12-30 LAB — COMPLETE METABOLIC PANEL WITH GFR
AG Ratio: 2.1 (calc) (ref 1.0–2.5)
ALT: 23 U/L (ref 9–46)
AST: 14 U/L (ref 10–35)
Albumin: 4.7 g/dL (ref 3.6–5.1)
Alkaline phosphatase (APISO): 77 U/L (ref 35–144)
BUN: 19 mg/dL (ref 7–25)
CO2: 26 mmol/L (ref 20–32)
Calcium: 9.5 mg/dL (ref 8.6–10.3)
Chloride: 105 mmol/L (ref 98–110)
Creat: 0.97 mg/dL (ref 0.70–1.30)
Globulin: 2.2 g/dL (calc) (ref 1.9–3.7)
Glucose, Bld: 89 mg/dL (ref 65–99)
Potassium: 3.9 mmol/L (ref 3.5–5.3)
Sodium: 139 mmol/L (ref 135–146)
Total Bilirubin: 0.7 mg/dL (ref 0.2–1.2)
Total Protein: 6.9 g/dL (ref 6.1–8.1)
eGFR: 90 mL/min/{1.73_m2} (ref >=60)

## 2021-12-30 LAB — HIV-1 RNA QUANT-NO REFLEX-BLD
HIV 1 RNA Quant: NOT DETECTED Copies/mL
HIV-1 RNA Quant, Log: NOT DETECTED Log cps/mL

## 2021-12-30 LAB — RPR: RPR Ser Ql: NONREACTIVE

## 2022-01-01 ENCOUNTER — Encounter (INDEPENDENT_AMBULATORY_CARE_PROVIDER_SITE_OTHER): Payer: Self-pay | Admitting: Bariatrics

## 2022-01-01 NOTE — Progress Notes (Signed)
Chief Complaint:   OBESITY Edward Hendricks is here to discuss his progress with his obesity treatment plan along with follow-up of his obesity related diagnoses. Edward Hendricks is on the Category 4 Plan and states he is following his eating plan approximately 90% of the time. Edward Hendricks states he is using the treadmill for 20 minutes 3 times per week.  Today's visit was #: 11 Starting weight: 280 lbs Starting date: 05/30/2021 Today's weight: 248 lbs Today's date: 12/28/2021 Total lbs lost to date: 32 lbs Total lbs lost since last in-office visit: 0  Interim History: Edward Hendricks weight stayed the same over the holidays.  Subjective:   1. Insulin resistance Edward Hendricks is currently taking Metformin.  2. Essential hypertension Edward Hendricks is taking Norvasc currently. His last blood pressure was 149/81.  Assessment/Plan:   1. Insulin resistance We will refill Metformin 500 mg for 1 month with no refills. Edward Hendricks will continue to work on weight loss, exercise, and decreasing simple carbohydrates to help decrease the risk of diabetes. Edward Hendricks agreed to follow-up with [place] as directed to closely monitor his progress.  - metFORMIN (GLUCOPHAGE) 500 MG tablet; Take 1 tablet (500 mg total) by mouth daily with lunch.  Dispense: 30 tablet; Refill: 0  2. Essential hypertension Edward Hendricks will continue taking his medications. He is working on healthy weight loss and exercise to improve blood pressure control. We will watch for signs of hypotension as he continues his lifestyle modifications.  3. Obesity, current BMI 37.8 Edward Hendricks is currently in the action stage of change. As such, his goal is to continue with weight loss efforts. He has agreed to the Category 4 Plan.   Edward Hendricks will continue meal planning and he will continue intentional eating. He will increase protein and keep water intake high.  Exercise goals:  As is.  Behavioral modification strategies: increasing lean protein intake, decreasing simple carbohydrates,  increasing vegetables, increasing water intake, decreasing eating out, no skipping meals, meal planning and cooking strategies, keeping healthy foods in the home, and planning for success.  Edward Hendricks has agreed to follow-up with our clinic in 4 weeks. He was informed of the importance of frequent follow-up visits to maximize his success with intensive lifestyle modifications for his multiple health conditions.   Objective:   Blood pressure 127/82, pulse 87, temperature 98 F (36.7 C), height 5\' 8"  (1.727 m), weight 248 lb (112.5 kg), SpO2 96 %. Body mass index is 37.71 kg/m.  General: Cooperative, alert, well developed, in no acute distress. HEENT: Conjunctivae and lids unremarkable. Cardiovascular: Regular rhythm.  Lungs: Normal work of breathing. Neurologic: No focal deficits.   Lab Results  Component Value Date   CREATININE 0.97 12/28/2021   BUN 19 12/28/2021   NA 139 12/28/2021   K 3.9 12/28/2021   CL 105 12/28/2021   CO2 26 12/28/2021   Lab Results  Component Value Date   ALT 23 12/28/2021   AST 14 12/28/2021   ALKPHOS 107 10/10/2021   BILITOT 0.7 12/28/2021   Lab Results  Component Value Date   HGBA1C 5.1 10/10/2021   HGBA1C 5.3 05/30/2021   HGBA1C 5.1 11/06/2019   Lab Results  Component Value Date   INSULIN 22.0 10/10/2021   INSULIN 35.9 (H) 05/30/2021   Lab Results  Component Value Date   TSH 1.31 04/04/2021   Lab Results  Component Value Date   CHOL 113 07/18/2021   HDL 31 (L) 07/18/2021   LDLCALC 64 07/18/2021   TRIG 91 07/18/2021   CHOLHDL 3.9 03/23/2021  Lab Results  Component Value Date   VD25OH 60.6 07/18/2021   VD25OH 34.15 04/04/2021   VD25OH 56.29 11/06/2019   Lab Results  Component Value Date   WBC 5.1 12/28/2021   HGB 15.6 12/28/2021   HCT 45.7 12/28/2021   MCV 91.4 12/28/2021   PLT 165 12/28/2021   No results found for: IRON, TIBC, FERRITIN  Attestation Statements:   Reviewed by clinician on day of visit: allergies,  medications, problem list, medical history, surgical history, family history, social history, and previous encounter notes.  I, Lizbeth Bark, RMA, am acting as Location manager for [employer], DO.  I have reviewed the above documentation for accuracy and completeness, and I agree with the above. Jearld Lesch, DO

## 2022-01-10 ENCOUNTER — Other Ambulatory Visit: Payer: Self-pay | Admitting: Internal Medicine

## 2022-01-10 DIAGNOSIS — E781 Pure hyperglyceridemia: Secondary | ICD-10-CM

## 2022-01-11 ENCOUNTER — Ambulatory Visit: Payer: No Typology Code available for payment source | Admitting: Infectious Diseases

## 2022-01-11 ENCOUNTER — Other Ambulatory Visit: Payer: Self-pay

## 2022-01-11 ENCOUNTER — Encounter: Payer: Self-pay | Admitting: Infectious Diseases

## 2022-01-11 DIAGNOSIS — E8881 Metabolic syndrome: Secondary | ICD-10-CM

## 2022-01-11 DIAGNOSIS — B20 Human immunodeficiency virus [HIV] disease: Secondary | ICD-10-CM

## 2022-01-11 DIAGNOSIS — K219 Gastro-esophageal reflux disease without esophagitis: Secondary | ICD-10-CM

## 2022-01-11 DIAGNOSIS — I10 Essential (primary) hypertension: Secondary | ICD-10-CM | POA: Diagnosis not present

## 2022-01-11 DIAGNOSIS — E88819 Insulin resistance, unspecified: Secondary | ICD-10-CM

## 2022-01-11 DIAGNOSIS — E7849 Other hyperlipidemia: Secondary | ICD-10-CM

## 2022-01-11 NOTE — Assessment & Plan Note (Signed)
Slightly up today.  Defer to PCP about ACE-I?

## 2022-01-11 NOTE — Progress Notes (Signed)
° °  Subjective:    Patient ID: Edward Hendricks, male  DOB: February 11, 1961, 61 y.o.        MRN: [id number]   HPI 61 yo M with hx of HIV+ and HTN. On norvasc, atripla ---> odefsy.   Had L5-S1 micro-decompression on 12-04-18.   Still having some back discomfort. Sees PCP for wt loss program. Has lost significant weight since engaging in this. Has plateaued.  Back pain mostly better except with exertion.    Spacing odefsey 12h from his GERD rx. taking qod. Less since stopping drinking sodas.  A1C 5.1% last check.  No ophtho in 2 yrs.   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 Gets repeated yearly.    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 Has gotten joint injections.   He and his husband are moving to [place] this fall.  Husband is (-). He defers Prep.   HIV 1 RNA Quant  Date Value  12/28/2021 Not Detected Copies/mL  03/23/2021 Not Detected Copies/mL  06/21/2020 <20 NOT DETECTED copies/mL   CD4 T Cell Abs (/uL)  Date Value  12/28/2021 555  03/23/2021 701  06/21/2020 750     Health Maintenance  Topic Date Due   COLONOSCOPY (Pts 45-64yrs Insurance coverage will need to be confirmed)  12/23/2022   TETANUS/TDAP  09/22/2023   INFLUENZA VACCINE  Completed   COVID-19 Vaccine  Completed   Hepatitis C Screening  Completed   HIV Screening  Completed   Zoster Vaccines- Shingrix  Completed   HPV VACCINES  Aged Out      Review of Systems  Constitutional:  Negative for weight loss.  Respiratory:  Negative for cough and shortness of breath.   Gastrointestinal:  Positive for constipation. Negative for diarrhea.  Genitourinary:  Negative for dysuria.  Musculoskeletal:  Positive for back pain.  Neurological:  Positive for sensory change.   Please see HPI. All other systems reviewed and negative.     Objective:  Physical Exam Vitals reviewed.  Constitutional:      Appearance: Normal appearance. He is obese.  HENT:     Mouth/Throat:     Mouth: Mucous membranes  are moist.     Pharynx: No oropharyngeal exudate.  Eyes:     Extraocular Movements: Extraocular movements intact.     Pupils: Pupils are equal, round, and reactive to light.  Cardiovascular:     Rate and Rhythm: Normal rate and regular rhythm.  Pulmonary:     Effort: Pulmonary effort is normal.     Breath sounds: Normal breath sounds.  Abdominal:     General: Bowel sounds are normal. There is no distension.     Palpations: Abdomen is soft.     Tenderness: There is no abdominal tenderness.  Musculoskeletal:        General: Normal range of motion.     Cervical back: Normal range of motion and neck supple.     Right lower leg: No edema.     Left lower leg: No edema.  Neurological:     General: No focal deficit present.     Mental Status: He is alert.           Assessment & Plan:

## 2022-01-11 NOTE — Assessment & Plan Note (Signed)
A1C good. On metformin.  Appreciate his PCP f/u Consider ozempic for glc and wt control?

## 2022-01-11 NOTE — Assessment & Plan Note (Signed)
Will have repeated at PCP, his prev numbers were quite good.

## 2022-01-11 NOTE — Assessment & Plan Note (Signed)
He continues to get EGD yearly.  Reflux sx better with wt loss.

## 2022-01-11 NOTE — Assessment & Plan Note (Signed)
He is doing very well.  Will continue him on odefsy.  U=u He is moving so I gave him the name of providers in [place], and their phone numbers.

## 2022-01-19 ENCOUNTER — Other Ambulatory Visit: Payer: Self-pay | Admitting: Internal Medicine

## 2022-01-19 DIAGNOSIS — I10 Essential (primary) hypertension: Secondary | ICD-10-CM

## 2022-01-24 ENCOUNTER — Other Ambulatory Visit (INDEPENDENT_AMBULATORY_CARE_PROVIDER_SITE_OTHER): Payer: Self-pay | Admitting: Bariatrics

## 2022-01-24 ENCOUNTER — Other Ambulatory Visit: Payer: Self-pay | Admitting: Infectious Diseases

## 2022-01-24 DIAGNOSIS — B20 Human immunodeficiency virus [HIV] disease: Secondary | ICD-10-CM

## 2022-01-24 DIAGNOSIS — E8881 Metabolic syndrome: Secondary | ICD-10-CM

## 2022-01-25 ENCOUNTER — Encounter (INDEPENDENT_AMBULATORY_CARE_PROVIDER_SITE_OTHER): Payer: Self-pay | Admitting: Bariatrics

## 2022-01-25 ENCOUNTER — Telehealth: Payer: Self-pay

## 2022-01-25 ENCOUNTER — Other Ambulatory Visit (HOSPITAL_COMMUNITY): Payer: Self-pay

## 2022-01-25 ENCOUNTER — Ambulatory Visit (INDEPENDENT_AMBULATORY_CARE_PROVIDER_SITE_OTHER): Payer: No Typology Code available for payment source | Admitting: Bariatrics

## 2022-01-25 ENCOUNTER — Other Ambulatory Visit: Payer: Self-pay

## 2022-01-25 VITALS — BP 122/83 | HR 92 | Temp 98.4°F | Ht 68.0 in | Wt 250.0 lb

## 2022-01-25 DIAGNOSIS — I10 Essential (primary) hypertension: Secondary | ICD-10-CM | POA: Diagnosis not present

## 2022-01-25 DIAGNOSIS — Z6838 Body mass index (BMI) 38.0-38.9, adult: Secondary | ICD-10-CM

## 2022-01-25 DIAGNOSIS — E88819 Insulin resistance, unspecified: Secondary | ICD-10-CM

## 2022-01-25 DIAGNOSIS — E8881 Metabolic syndrome: Secondary | ICD-10-CM | POA: Diagnosis not present

## 2022-01-25 DIAGNOSIS — Z6841 Body Mass Index (BMI) 40.0 and over, adult: Secondary | ICD-10-CM

## 2022-01-25 DIAGNOSIS — E66813 Obesity, class 3: Secondary | ICD-10-CM

## 2022-01-25 DIAGNOSIS — E669 Obesity, unspecified: Secondary | ICD-10-CM | POA: Diagnosis not present

## 2022-01-25 MED ORDER — METFORMIN HCL 500 MG PO TABS: 500.0000 mg | ORAL_TABLET | Freq: Every day | ORAL | 0 refills | Status: DC

## 2022-01-25 MED ORDER — METFORMIN HCL 500 MG PO TABS: 500.0000 mg | ORAL_TABLET | Freq: Two times a day (BID) | ORAL | 0 refills | Status: DC

## 2022-01-25 NOTE — Telephone Encounter (Signed)
I just printed the form out and I will fax his labs and chart notes with the PA .Marland KitchenMarland Kitchen

## 2022-01-25 NOTE — Progress Notes (Signed)
Chief Complaint:   OBESITY Edward Hendricks is here to discuss his progress with his obesity treatment plan along with follow-up of his obesity related diagnoses. Edward Hendricks is on the Category 4 Plan and states he is following his eating plan approximately 90% of the time. Joanthony states he is using the treadmill for 20 minutes 3 times per week.  Today's visit was #: 12 Starting weight: 280 lbs Starting date: 05/30/2021 Today's weight: 250 lbs Today's date: 01/25/2022 Total lbs lost to date: 30 lbs Total lbs lost since last in-office visit: 0  Interim History: Edward Hendricks is up 2 additional pounds since his last visit.  Subjective:   1. Insulin resistance Edward Hendricks is taking Metformin currently.  2. Essential hypertension Edward Hendricks's blood pressure is controlled. His last blood pressure was 127/82.  Assessment/Plan:   1. Insulin resistance We will refill Metformin 500 mg for 1 month with no refills. Edward Hendricks will continue to work on weight loss, exercise, and decreasing simple carbohydrates to help decrease the risk of diabetes. Edward Hendricks agreed to follow-up with [place] as directed to closely monitor his progress.  - metFORMIN (GLUCOPHAGE) 500 MG tablet; Take 1 tablet (500 mg total) by mouth 2 (two) times daily with a meal.  Dispense: 60 tablet; Refill: 0  2. Essential hypertension Edward Hendricks will continue taking his medications. He is working on healthy weight loss and exercise to improve blood pressure control. We will watch for signs of hypotension as he continues his lifestyle modifications.  3. Obesity, current BMI 38.0 Edward Hendricks is currently in the action stage of change. As such, his goal is to continue with weight loss efforts. He has agreed to following a lower carbohydrate, vegetable and lean protein rich diet plan.   Exercise goals:  Edward Hendricks will drink 1 powerade zero or 6-20 ounces daily.   Behavioral modification strategies: increasing lean protein intake, decreasing simple carbohydrates, increasing  vegetables, increasing water intake, decreasing eating out, no skipping meals, meal planning and cooking strategies, keeping healthy foods in the home, and planning for success.  Edward Hendricks has agreed to follow-up with our clinic in 4 weeks. He was informed of the importance of frequent follow-up visits to maximize his success with intensive lifestyle modifications for his multiple health conditions.   Objective:   Blood pressure 122/83, pulse 92, temperature 98.4 F (36.9 C), height 5\' 8"  (1.727 m), weight 250 lb (113.4 kg), SpO2 96 %. Body mass index is 38.01 kg/m.  General: Cooperative, alert, well developed, in no acute distress. HEENT: Conjunctivae and lids unremarkable. Cardiovascular: Regular rhythm.  Lungs: Normal work of breathing. Neurologic: No focal deficits.   Lab Results  Component Value Date   CREATININE 0.97 12/28/2021   BUN 19 12/28/2021   NA 139 12/28/2021   K 3.9 12/28/2021   CL 105 12/28/2021   CO2 26 12/28/2021   Lab Results  Component Value Date   ALT 23 12/28/2021   AST 14 12/28/2021   ALKPHOS 107 10/10/2021   BILITOT 0.7 12/28/2021   Lab Results  Component Value Date   HGBA1C 5.1 10/10/2021   HGBA1C 5.3 05/30/2021   HGBA1C 5.1 11/06/2019   Lab Results  Component Value Date   INSULIN 22.0 10/10/2021   INSULIN 35.9 (H) 05/30/2021   Lab Results  Component Value Date   TSH 1.31 04/04/2021   Lab Results  Component Value Date   CHOL 113 07/18/2021   HDL 31 (L) 07/18/2021   LDLCALC 64 07/18/2021   TRIG 91 07/18/2021   CHOLHDL 3.9  03/23/2021   Lab Results  Component Value Date   VD25OH 60.6 07/18/2021   VD25OH 34.15 04/04/2021   VD25OH 56.29 11/06/2019   Lab Results  Component Value Date   WBC 5.1 12/28/2021   HGB 15.6 12/28/2021   HCT 45.7 12/28/2021   MCV 91.4 12/28/2021   PLT 165 12/28/2021   No results found for: IRON, TIBC, FERRITIN  Attestation Statements:   Reviewed by clinician on day of visit: allergies, medications,  problem list, medical history, surgical history, family history, social history, and previous encounter notes.  I, Lizbeth Bark, RMA, am acting as Location manager for [employer], DO.  I have reviewed the above documentation for accuracy and completeness, and I agree with the above. Jearld Lesch, DO

## 2022-01-25 NOTE — Telephone Encounter (Signed)
[facility] Patient Advocate Encounter   Received notification from [facility] that prior authorization for Vernell Leep is required.   PA submitted on 01/25/22 Faxed chart notes & labs to [phone] Status is pending    [facility] will continue to follow.   Ileene Patrick, [facility] Patient [facility] Phone: [phone] Fax:  [phone]

## 2022-01-25 NOTE — Telephone Encounter (Signed)
You welcome 

## 2022-01-28 ENCOUNTER — Encounter (INDEPENDENT_AMBULATORY_CARE_PROVIDER_SITE_OTHER): Payer: Self-pay | Admitting: Bariatrics

## 2022-01-29 ENCOUNTER — Other Ambulatory Visit (HOSPITAL_COMMUNITY): Payer: Self-pay

## 2022-01-29 NOTE — Telephone Encounter (Signed)
Thanks Diminique

## 2022-01-29 NOTE — Telephone Encounter (Signed)
Odefsey for the patient has been approved. 01/29/23-01/28/23.  Approval faxed to the pharmacy.

## 2022-01-30 ENCOUNTER — Other Ambulatory Visit: Payer: Self-pay

## 2022-01-30 DIAGNOSIS — B20 Human immunodeficiency virus [HIV] disease: Secondary | ICD-10-CM

## 2022-01-31 ENCOUNTER — Ambulatory Visit: Payer: No Typology Code available for payment source | Admitting: Internal Medicine

## 2022-01-31 VITALS — BP 130/80 | HR 80 | Temp 98.1°F | Wt 256.3 lb

## 2022-01-31 DIAGNOSIS — E7849 Other hyperlipidemia: Secondary | ICD-10-CM | POA: Diagnosis not present

## 2022-01-31 DIAGNOSIS — H539 Unspecified visual disturbance: Secondary | ICD-10-CM | POA: Diagnosis not present

## 2022-01-31 DIAGNOSIS — B20 Human immunodeficiency virus [HIV] disease: Secondary | ICD-10-CM

## 2022-01-31 DIAGNOSIS — I10 Essential (primary) hypertension: Secondary | ICD-10-CM | POA: Diagnosis not present

## 2022-01-31 MED ORDER — AMLODIPINE BESYLATE 10 MG PO TABS: 10.0000 mg | ORAL_TABLET | Freq: Every day | ORAL | 1 refills | Status: DC

## 2022-01-31 NOTE — Patient Instructions (Signed)
-  Nice seeing you today!!  -Schedule follow up in 6 months for your physical.

## 2022-01-31 NOTE — Progress Notes (Signed)
Established Patient Office Visit     This visit occurred during the SARS-CoV-2 public health emergency.  Safety protocols were in place, including screening questions prior to the visit, additional usage of staff PPE, and extensive cleaning of exam room while observing appropriate contact time as indicated for disinfecting solutions.    CC/Reason for Visit: Follow-up chronic conditions  HPI: Edward Hendricks is a 61 y.o. male who is coming in today for the above mentioned reasons. Past Medical History is significant for: Hypertension, hyperlipidemia, HIV, GERD with Barrett's esophagus who follows with GI for yearly EGDs.  He is requesting an ophthalmology referral.  He has lost 30 pounds at healthy weight and wellness.   Past Medical/Surgical History: Past Medical History:  Diagnosis Date   Allergy    seasonal   Arthritis    "hands" (12/04/2018)   Barrett's esophagus    Constipation    Edema of both lower extremities    GERD (gastroesophageal reflux disease)    High cholesterol    HIV (human immunodeficiency virus infection) ([place]) dx'd 2002   Hypertension    IBS (irritable bowel syndrome)    Joint pain    Lower back pain    Personal history of colonic adenomas 03/10/2013   Pneumonia 1990s    from HIV   SOB (shortness of breath)    Vitamin D deficiency     Past Surgical History:  Procedure Laterality Date   BACK SURGERY     COLONOSCOPY     COLONOSCOPY W/ BIOPSIES AND POLYPECTOMY     ESOPHAGOGASTRODUODENOSCOPY     x2   LUMBAR LAMINECTOMY/DECOMPRESSION MICRODISCECTOMY  12/04/2018   L5-S1   LUMBAR LAMINECTOMY/DECOMPRESSION MICRODISCECTOMY N/A 12/04/2018   Procedure: Microlumbar decompression L5-S1;  Surgeon: Susa Day, MD;  Location: [facility];  Service: Orthopedics;  Laterality: N/A;  120 mins   POLYPECTOMY     TONSILLECTOMY     UPPER GASTROINTESTINAL ENDOSCOPY     WISDOM TOOTH EXTRACTION  2009    Social History:  reports that he has never smoked. He has  never used smokeless tobacco. He reports that he does not currently use alcohol. He reports that he does not use drugs.  Allergies: Allergies  Allergen Reactions   Amoxicillin Rash    Family History:  Family History  Problem Relation Age of Onset   Depression Mother        previously hospitalized   Colon cancer Neg Hx    Colon polyps Neg Hx    Esophageal cancer Neg Hx    Rectal cancer Neg Hx    Stomach cancer Neg Hx      Current Outpatient Medications:    atorvastatin (LIPITOR) 40 MG tablet, TAKE 1 TABLET BY MOUTH EVERY DAY, Disp: 90 tablet, Rfl: 0   emtricitabine-rilpivir-tenofovir AF (ODEFSEY) 200-25-25 MG TABS tablet, Take 1 tablet by mouth daily with breakfast., Disp: 90 tablet, Rfl: 3   fexofenadine (ALLEGRA) 180 MG tablet, Take 180 mg by mouth daily., Disp: , Rfl:    metFORMIN (GLUCOPHAGE) 500 MG tablet, Take 1 tablet (500 mg total) by mouth 2 (two) times daily with a meal., Disp: 60 tablet, Rfl: 0   pantoprazole (PROTONIX) 20 MG tablet, Take 1 tablet (20 mg total) by mouth every other day., Disp: 45 tablet, Rfl: 1   tiZANidine (ZANAFLEX) 4 MG capsule, Take 4 mg by mouth 3 (three) times daily., Disp: , Rfl:    Vitamin D, Cholecalciferol, 50 MCG (2000 UT) CAPS, Take 1 capsule by mouth  daily., Disp: , Rfl:    amLODipine (NORVASC) 10 MG tablet, Take 1 tablet (10 mg total) by mouth daily. SCHEDULE APPT WITH MD FOR REFILLS, Disp: 90 tablet, Rfl: 1  Review of Systems:  Constitutional: Denies fever, chills, diaphoresis, appetite change and fatigue.  HEENT: Denies photophobia, eye pain, redness, hearing loss, ear pain, congestion, sore throat, rhinorrhea, sneezing, mouth sores, trouble swallowing, neck pain, neck stiffness and tinnitus.   Respiratory: Denies SOB, DOE, cough, chest tightness,  and wheezing.   Cardiovascular: Denies chest pain, palpitations and leg swelling.  Gastrointestinal: Denies nausea, vomiting, abdominal pain, diarrhea, constipation, blood in stool and  abdominal distention.  Genitourinary: Denies dysuria, urgency, frequency, hematuria, flank pain and difficulty urinating.  Endocrine: Denies: hot or cold intolerance, sweats, changes in hair or nails, polyuria, polydipsia. Musculoskeletal: Denies myalgias, back pain, joint swelling, arthralgias and gait problem.  Skin: Denies pallor, rash and wound.  Neurological: Denies dizziness, seizures, syncope, weakness, light-headedness, numbness and headaches.  Hematological: Denies adenopathy. Easy bruising, personal or family bleeding history  Psychiatric/Behavioral: Denies suicidal ideation, mood changes, confusion, nervousness, sleep disturbance and agitation    Physical Exam: Vitals:   01/31/22 1123  BP: 130/80  Pulse: 80  Temp: 98.1 F (36.7 C)  TempSrc: Oral  SpO2: 99%  Weight: 256 lb 4.8 oz (116.3 kg)    Body mass index is 38.97 kg/m.   Constitutional: NAD, calm, comfortable Eyes: PERRL, lids and conjunctivae normal, wears corrective lenses ENMT: Mucous membranes are moist.  Respiratory: clear to auscultation bilaterally, no wheezing, no crackles. Normal respiratory effort. No accessory muscle use.  Cardiovascular: Regular rate and rhythm, no murmurs / rubs / gallops. No extremity edema.  Neurologic: Grossly intact and nonfocal Psychiatric: Normal judgment and insight. Alert and oriented x 3. Normal mood.    Impression and Plan:  Vision changes  - Plan: Ambulatory referral to Ophthalmology  Human immunodeficiency virus (HIV) disease ([place]) -Followed by infectious diseases.  Other hyperlipidemia -Check lipids when he returns fasting.  HTN (hypertension), benign  - Plan: amLODipine (NORVASC) 10 MG tablet -Fair control.  Time spent: 30 minutes reviewing chart, interviewing and examining patient and formulating plan of care   Patient Instructions  -Nice seeing you today!!  -Schedule follow up in 6 months for your physical.    [name], MD [place]  Primary Care at [facility] .

## 2022-02-15 ENCOUNTER — Ambulatory Visit (INDEPENDENT_AMBULATORY_CARE_PROVIDER_SITE_OTHER): Payer: No Typology Code available for payment source | Admitting: Bariatrics

## 2022-02-15 ENCOUNTER — Other Ambulatory Visit: Payer: Self-pay

## 2022-02-15 ENCOUNTER — Encounter (INDEPENDENT_AMBULATORY_CARE_PROVIDER_SITE_OTHER): Payer: Self-pay | Admitting: Bariatrics

## 2022-02-15 VITALS — BP 125/87 | Temp 98.0°F | Ht 68.0 in | Wt 249.0 lb

## 2022-02-15 DIAGNOSIS — E8881 Metabolic syndrome: Secondary | ICD-10-CM | POA: Diagnosis not present

## 2022-02-15 DIAGNOSIS — E669 Obesity, unspecified: Secondary | ICD-10-CM | POA: Diagnosis not present

## 2022-02-15 DIAGNOSIS — Z6837 Body mass index (BMI) 37.0-37.9, adult: Secondary | ICD-10-CM | POA: Diagnosis not present

## 2022-02-15 DIAGNOSIS — I10 Essential (primary) hypertension: Secondary | ICD-10-CM | POA: Diagnosis not present

## 2022-02-15 MED ORDER — METFORMIN HCL 500 MG PO TABS: 500.0000 mg | ORAL_TABLET | Freq: Two times a day (BID) | ORAL | 0 refills | Status: AC

## 2022-02-15 NOTE — Progress Notes (Signed)
? ? ? ?Chief Complaint:  ? ?OBESITY ?Edward Hendricks is here to discuss his progress with his obesity treatment plan along with follow-up of his obesity related diagnoses. Edward Hendricks is on the Category 4 Plan and states he is following his eating plan approximately 95% of the time. Edward Hendricks states he is doing 0 minutes 0 times per week. ? ?Today's visit was #: 13 ?Starting weight: 280 lbs ?Starting date: 05/30/2021 ?Today's weight: 249 lbs ?Today's date: 02/15/2022 ?Total lbs lost to date: 31 lbs ?Total lbs lost since last in-office visit: 1 lb ? ?Interim History: Edward Hendricks is down 1 additional pound.  ? ?Subjective:  ? ?1. Insulin resistance ?Edward Hendricks is taking Metformin currently.  ? ?2. Essential hypertension ?Edward Hendricks is currently taking Norvasc. His blood pressure is controlled. His last blood pressure was 122/83. ? ?Assessment/Plan:  ? ?1. Insulin resistance ?We will refill Metformin 500 mg for 1 month with no refills. Edward Hendricks will continue to work on weight loss, exercise, and decreasing simple carbohydrates to help decrease the risk of diabetes. Edward Hendricks agreed to follow-up with [place] as directed to closely monitor his progress. ? ?- metFORMIN (GLUCOPHAGE) 500 MG tablet; Take 1 tablet (500 mg total) by mouth 2 (two) times daily with a meal.  Dispense: 60 tablet; Refill: 0 ? ?2. Essential hypertension ?Edward Hendricks will continue taking Norvasc. He is working on healthy weight loss and exercise to improve blood pressure control. We will watch for signs of hypotension as he continues his lifestyle modifications. ? ?3. Obesity, current BMI 37.9 ?Edward Hendricks is currently in the action stage of change. As such, his goal is to continue with weight loss efforts. He has agreed to the Category 4 Plan and keeping a food journal and adhering to recommended goals of 1600 calories and at least 90 grams of protein.  ? ?Edward Hendricks will continue meal planning and he will continue intentional eating. He will keep his water intake high.  ? ?Exercise goals:  Edward Hendricks's back  pain has worsen.  ? ?Behavioral modification strategies: increasing lean protein intake, decreasing simple carbohydrates, increasing vegetables, increasing water intake, decreasing eating out, no skipping meals, meal planning and cooking strategies, keeping healthy foods in the home, and planning for success. ? ?Edward Hendricks has agreed to follow-up with our clinic in 4 weeks. He was informed of the importance of frequent follow-up visits to maximize his success with intensive lifestyle modifications for his multiple health conditions.  ? ?Objective:  ? ?Blood pressure 125/87, temperature 98 ?F (36.7 ?C), height 5\' 8"  (1.727 m), weight 249 lb (112.9 kg), SpO2 94 %. ?Body mass index is 37.86 kg/m?. ? ?General: Cooperative, alert, well developed, in no acute distress. ?HEENT: Conjunctivae and lids unremarkable. ?Cardiovascular: Regular rhythm.  ?Lungs: Normal work of breathing. ?Neurologic: No focal deficits.  ? ?Lab Results  ?Component Value Date  ? CREATININE 0.97 12/28/2021  ? BUN 19 12/28/2021  ? NA 139 12/28/2021  ? K 3.9 12/28/2021  ? CL 105 12/28/2021  ? CO2 26 12/28/2021  ? ?Lab Results  ?Component Value Date  ? ALT 23 12/28/2021  ? AST 14 12/28/2021  ? ALKPHOS 107 10/10/2021  ? BILITOT 0.7 12/28/2021  ? ?Lab Results  ?Component Value Date  ? HGBA1C 5.1 10/10/2021  ? HGBA1C 5.3 05/30/2021  ? HGBA1C 5.1 11/06/2019  ? ?Lab Results  ?Component Value Date  ? INSULIN 22.0 10/10/2021  ? INSULIN 35.9 (H) 05/30/2021  ? ?Lab Results  ?Component Value Date  ? TSH 1.31 04/04/2021  ? ?Lab Results  ?Component Value  Date  ? CHOL 113 07/18/2021  ? HDL 31 (L) 07/18/2021  ? [place] 64 07/18/2021  ? TRIG 91 07/18/2021  ? CHOLHDL 3.9 03/23/2021  ? ?Lab Results  ?Component Value Date  ? VD25OH 60.6 07/18/2021  ? VD25OH 34.15 04/04/2021  ? VD25OH 56.29 11/06/2019  ? ?Lab Results  ?Component Value Date  ? WBC 5.1 12/28/2021  ? HGB 15.6 12/28/2021  ? HCT 45.7 12/28/2021  ? MCV 91.4 12/28/2021  ? PLT 165 12/28/2021  ? ?No results found for:  IRON, TIBC, FERRITIN ? ?Attestation Statements:  ? ?Reviewed by clinician on day of visit: allergies, medications, problem list, medical history, surgical history, family history, social history, and previous encounter notes. ? ?I, Lizbeth Bark, RMA, am acting as transcriptionist for [employer], DO. ? ?I have reviewed the above documentation for accuracy and completeness, and I agree with the above. Jearld Lesch, DO ? ?

## 2022-02-18 ENCOUNTER — Encounter (INDEPENDENT_AMBULATORY_CARE_PROVIDER_SITE_OTHER): Payer: Self-pay | Admitting: Bariatrics

## 2022-02-21 ENCOUNTER — Other Ambulatory Visit (INDEPENDENT_AMBULATORY_CARE_PROVIDER_SITE_OTHER): Payer: Self-pay | Admitting: Bariatrics

## 2022-02-21 DIAGNOSIS — E8881 Metabolic syndrome: Secondary | ICD-10-CM

## 2022-02-21 NOTE — Telephone Encounter (Signed)
Dr.Brown 

## 2022-03-15 ENCOUNTER — Ambulatory Visit (INDEPENDENT_AMBULATORY_CARE_PROVIDER_SITE_OTHER): Payer: No Typology Code available for payment source | Admitting: Bariatrics

## 2022-03-15 ENCOUNTER — Encounter (INDEPENDENT_AMBULATORY_CARE_PROVIDER_SITE_OTHER): Payer: Self-pay | Admitting: Bariatrics

## 2022-03-15 VITALS — BP 143/77 | HR 91 | Temp 98.1°F | Ht 68.0 in | Wt 256.0 lb

## 2022-03-15 DIAGNOSIS — E8881 Metabolic syndrome: Secondary | ICD-10-CM

## 2022-03-15 DIAGNOSIS — I10 Essential (primary) hypertension: Secondary | ICD-10-CM | POA: Diagnosis not present

## 2022-03-15 DIAGNOSIS — Z6839 Body mass index (BMI) 39.0-39.9, adult: Secondary | ICD-10-CM

## 2022-03-15 DIAGNOSIS — E669 Obesity, unspecified: Secondary | ICD-10-CM

## 2022-03-15 NOTE — Progress Notes (Signed)
? ? ? ?Chief Complaint:  ? ?OBESITY ?Edward Hendricks is here to discuss his progress with his obesity treatment plan along with follow-up of his obesity related diagnoses. Edward Hendricks is on the Category 4 Plan and states he is following his eating plan approximately 95% of the time. Edward Hendricks states he is doing 0 minutes 0 times per week. ? ?Today's visit was #: 14 ?Starting weight: 280 lbs ?Starting date: 05/30/2021 ?Today's weight: 256 lbs ?Today's date: 03/15/2022 ?Total lbs lost to date: 24 lbs ?Total lbs lost since last in-office visit: 0 ? ?Interim History: Edward Hendricks is up 1 lb since his last visit.  ? ?Subjective:  ? ?1. Primary hypertension ?Edward Hendricks is currently taking Norvasc.  ? ?2. Insulin resistance ?Edward Hendricks is taking Metformin currently.  ? ?Assessment/Plan:  ? ?1. Primary hypertension ?Edward Hendricks will continue taking Norvasc. He is working on healthy weight loss and exercise to improve blood pressure control. We will watch for signs of hypotension as he continues his lifestyle modifications. ? ?2. Insulin resistance ?Edward Hendricks will continue Metformin. He will continue to work on weight loss, exercise, and decreasing simple carbohydrates to help decrease the risk of diabetes. Edward Hendricks agreed to follow-up with [place] as directed to closely monitor his progress. ? ?3. Obesity, current BMI 39.0 ?Edward Hendricks is currently in the action stage of change. As such, his goal is to continue with weight loss efforts. He has agreed to the Category 4 Plan.  ? ?Edward Hendricks will continue meal planning and he will continue intentional eating. 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 ? ?Exercise goals:  Edward Hendricks is struggling with back pain. He will decrease exercise.  ? ?Behavioral modification strategies: increasing lean protein intake, decreasing simple carbohydrates, increasing vegetables, increasing water intake, decreasing eating out, no skipping meals, meal planning and cooking strategies, keeping healthy foods in the home, and planning for  success. ? ?Edward Hendricks has agreed to follow-up with our clinic possibly in several months. He wants to continue to see a provider about his back pain and get that resolved before returning. Marland Kitchen He was informed of the importance of frequent follow-up visits to maximize his success with intensive lifestyle modifications for his multiple health conditions.  ? ?Objective:  ? ?Blood pressure (!) 143/77, pulse 91, temperature 98.1 ?F (36.7 ?C), height '5\' 8"'$  (1.727 m), weight 256 lb (116.1 kg), SpO2 96 %. ?Body mass index is 38.92 kg/m?. ? ?General: Cooperative, alert, well developed, in no acute distress. ?HEENT: Conjunctivae and lids unremarkable. ?Cardiovascular: Regular rhythm.  ?Lungs: Normal work of breathing. ?Neurologic: No focal deficits.  ? ?Lab Results  ?Component Value Date  ? CREATININE 0.97 12/28/2021  ? BUN 19 12/28/2021  ? NA 139 12/28/2021  ? K 3.9 12/28/2021  ? CL 105 12/28/2021  ? CO2 26 12/28/2021  ? ?Lab Results  ?Component Value Date  ? ALT 23 12/28/2021  ? AST 14 12/28/2021  ? ALKPHOS 107 10/10/2021  ? BILITOT 0.7 12/28/2021  ? ?Lab Results  ?Component Value Date  ? HGBA1C 5.1 10/10/2021  ? HGBA1C 5.3 05/30/2021  ? HGBA1C 5.1 11/06/2019  ? ?Lab Results  ?Component Value Date  ? INSULIN 22.0 10/10/2021  ? INSULIN 35.9 (H) 05/30/2021  ? ?Lab Results  ?Component Value Date  ? TSH 1.31 04/04/2021  ? ?Lab Results  ?Component Value Date  ? CHOL 113 07/18/2021  ? HDL 31 (L) 07/18/2021  ? [place] 64 07/18/2021  ? TRIG 91 07/18/2021  ? CHOLHDL 3.9 03/23/2021  ? ?Lab Results  ?  Component Value Date  ? VD25OH 60.6 07/18/2021  ? VD25OH 34.15 04/04/2021  ? VD25OH 56.29 11/06/2019  ? ?Lab Results  ?Component Value Date  ? WBC 5.1 12/28/2021  ? HGB 15.6 12/28/2021  ? HCT 45.7 12/28/2021  ? MCV 91.4 12/28/2021  ? PLT 165 12/28/2021  ? ?No results found for: IRON, TIBC, FERRITIN ? ?Attestation Statements:  ? ?Reviewed by clinician on day of visit: allergies, medications, problem list, medical history, surgical history,  family history, social history, and previous encounter notes. ? ?I, Edward Hendricks, RMA, am acting as transcriptionist for [employer], DO. ? ?I have reviewed the above documentation for accuracy and completeness, and I agree with the above. Edward Lesch, DO ? ?

## 2022-03-16 ENCOUNTER — Other Ambulatory Visit (INDEPENDENT_AMBULATORY_CARE_PROVIDER_SITE_OTHER): Payer: Self-pay | Admitting: Bariatrics

## 2022-03-16 DIAGNOSIS — E8881 Metabolic syndrome: Secondary | ICD-10-CM

## 2022-04-12 ENCOUNTER — Other Ambulatory Visit: Payer: Self-pay | Admitting: Internal Medicine

## 2022-04-12 DIAGNOSIS — E781 Pure hyperglyceridemia: Secondary | ICD-10-CM

## 2022-04-17 ENCOUNTER — Other Ambulatory Visit (INDEPENDENT_AMBULATORY_CARE_PROVIDER_SITE_OTHER): Payer: Self-pay | Admitting: Bariatrics

## 2022-04-17 DIAGNOSIS — E8881 Metabolic syndrome: Secondary | ICD-10-CM

## 2022-04-22 ENCOUNTER — Other Ambulatory Visit: Payer: Self-pay | Admitting: Infectious Diseases

## 2022-04-22 DIAGNOSIS — B20 Human immunodeficiency virus [HIV] disease: Secondary | ICD-10-CM

## 2022-04-23 ENCOUNTER — Encounter: Payer: Self-pay | Admitting: Internal Medicine

## 2022-05-28 ENCOUNTER — Encounter: Payer: Self-pay | Admitting: Infectious Diseases

## 2022-07-04 ENCOUNTER — Other Ambulatory Visit: Payer: Self-pay | Admitting: Internal Medicine

## 2022-07-12 ENCOUNTER — Other Ambulatory Visit: Payer: Self-pay | Admitting: Internal Medicine

## 2022-07-12 DIAGNOSIS — E781 Pure hyperglyceridemia: Secondary | ICD-10-CM

## 2022-07-16 ENCOUNTER — Telehealth: Payer: Self-pay | Admitting: Infectious Diseases

## 2022-07-16 NOTE — Telephone Encounter (Signed)
RTC to patient has a new insurance now.  The Insurance that he has now is moe of a discount card.  Cost of medication will be $3500.  Wants to get something else was a [place] a few years ago.  Will send a message to [place] to see if there are any assistance programs for this medication as well.

## 2022-07-16 NOTE — Telephone Encounter (Signed)
Pt requesting a call back about the following medication.  Pt states his new Ins ([employer]) will not cover ODEFSEY 200-25-25 MG TABS tablet.  [id number] [place], [place] - [address]

## 2022-07-17 ENCOUNTER — Other Ambulatory Visit (HOSPITAL_COMMUNITY): Payer: Self-pay

## 2022-07-17 ENCOUNTER — Telehealth: Payer: Self-pay

## 2022-07-17 NOTE — Telephone Encounter (Signed)
Left message requesting call back regarding medication assistance (for odefsey).

## 2022-07-18 NOTE — Telephone Encounter (Signed)
Pt returned phone call.  Informed pt about patient assistance program for [facility] Advancing Access). Pt is currently uninsured and has no income. He would like to apply (allowed me to sign on his behalf to get process moving). Pt has 16 days worth of medication remaining.

## 2022-07-25 ENCOUNTER — Encounter (INDEPENDENT_AMBULATORY_CARE_PROVIDER_SITE_OTHER): Payer: Self-pay

## 2022-07-30 NOTE — Telephone Encounter (Signed)
Patient calling back for status update on PAP. States he has 4 days left of medication.

## 2022-08-03 NOTE — Telephone Encounter (Signed)
Copay card called to pharmacy. $0 copay for 30 day supply

## 2022-08-03 NOTE — Telephone Encounter (Signed)
Pt called again about his medication. His [employer] patient assistance application has been submitted. Just waiting on response. Pt says he has 1 pill left.

## 2022-08-03 NOTE — Telephone Encounter (Signed)
Was able to call [employer] and get a 30 day copay card for patient while waiting for patient assistance enrollment.   Informed patient where application stands (still have to fax a "no income" letter to company, explaining that patient is supported by husbands Fish farm manager.  Also informed patient I will call copay card info to pharmacy for patient to pickup medication. Pt would like me to email him card info as well.

## 2022-08-10 ENCOUNTER — Telehealth: Payer: Self-pay

## 2022-08-10 NOTE — Telephone Encounter (Signed)
Received notification from [place] regarding approval for ODEFSEY. Patient assistance approved from 08/03/22 to 08/03/23.  Pt will pick up medication at pharmacy using following card info:   Or patient can contact company to have medication shipped to their home.   Phone: [phone]  Approval letter scanned to pt's media  Pt also qualifies for ADAP and needs to apply.

## 2022-08-17 ENCOUNTER — Other Ambulatory Visit: Payer: Self-pay | Admitting: Internal Medicine

## 2022-08-17 DIAGNOSIS — I10 Essential (primary) hypertension: Secondary | ICD-10-CM

## 2022-09-26 ENCOUNTER — Other Ambulatory Visit: Payer: Self-pay | Admitting: Internal Medicine

## 2022-09-26 DIAGNOSIS — E781 Pure hyperglyceridemia: Secondary | ICD-10-CM

## 2022-10-22 ENCOUNTER — Other Ambulatory Visit: Payer: Self-pay | Admitting: Infectious Diseases

## 2022-10-22 DIAGNOSIS — B20 Human immunodeficiency virus [HIV] disease: Secondary | ICD-10-CM

## 2022-12-26 ENCOUNTER — Other Ambulatory Visit: Payer: Self-pay | Admitting: Internal Medicine

## 2022-12-26 DIAGNOSIS — E781 Pure hyperglyceridemia: Secondary | ICD-10-CM

## 2023-01-06 ENCOUNTER — Other Ambulatory Visit: Payer: Self-pay | Admitting: Internal Medicine

## 2023-01-20 ENCOUNTER — Other Ambulatory Visit: Payer: Self-pay | Admitting: Internal Medicine

## 2023-01-20 DIAGNOSIS — E781 Pure hyperglyceridemia: Secondary | ICD-10-CM

## 2023-02-22 ENCOUNTER — Other Ambulatory Visit: Payer: Self-pay | Admitting: Internal Medicine

## 2023-02-22 DIAGNOSIS — I10 Essential (primary) hypertension: Secondary | ICD-10-CM

## 2023-03-27 ENCOUNTER — Other Ambulatory Visit: Payer: Self-pay | Admitting: Internal Medicine

## 2023-03-27 DIAGNOSIS — I10 Essential (primary) hypertension: Secondary | ICD-10-CM

## 2023-04-21 ENCOUNTER — Other Ambulatory Visit: Payer: Self-pay | Admitting: Internal Medicine

## 2023-04-21 DIAGNOSIS — I10 Essential (primary) hypertension: Secondary | ICD-10-CM

## 2023-05-21 ENCOUNTER — Other Ambulatory Visit: Payer: Self-pay | Admitting: Internal Medicine

## 2023-05-21 DIAGNOSIS — I10 Essential (primary) hypertension: Secondary | ICD-10-CM

## 2023-05-25 ENCOUNTER — Other Ambulatory Visit: Payer: Self-pay | Admitting: Internal Medicine

## 2023-05-25 DIAGNOSIS — I10 Essential (primary) hypertension: Secondary | ICD-10-CM

## 2023-07-24 ENCOUNTER — Other Ambulatory Visit: Payer: Self-pay | Admitting: Internal Medicine

## 2023-07-24 DIAGNOSIS — E781 Pure hyperglyceridemia: Secondary | ICD-10-CM

## 2023-08-20 ENCOUNTER — Other Ambulatory Visit: Payer: Self-pay | Admitting: Internal Medicine

## 2023-08-20 DIAGNOSIS — E781 Pure hyperglyceridemia: Secondary | ICD-10-CM

## 2023-08-23 ENCOUNTER — Other Ambulatory Visit: Payer: Self-pay | Admitting: Internal Medicine

## 2023-08-23 DIAGNOSIS — E781 Pure hyperglyceridemia: Secondary | ICD-10-CM

## 2023-10-29 ENCOUNTER — Encounter: Payer: Self-pay | Admitting: Internal Medicine
